# Patient Record
Sex: Female | Born: 1980 | Hispanic: Yes | Marital: Single | Smoking: Never smoker
Health system: Southern US, Community
[De-identification: ages and names within clinical notes are randomized; demographics above are authoritative.]

## PROBLEM LIST (undated history)

## (undated) ENCOUNTER — Inpatient Hospital Stay: Payer: Self-pay

## (undated) DIAGNOSIS — Z789 Other specified health status: Secondary | ICD-10-CM

## (undated) HISTORY — PX: NO PAST SURGERIES: SHX2092

---

## 2005-02-08 ENCOUNTER — Ambulatory Visit: Payer: Self-pay | Admitting: Family Medicine

## 2005-06-20 ENCOUNTER — Inpatient Hospital Stay (HOSPITAL_COMMUNITY): Admission: AD | Admit: 2005-06-20 | Discharge: 2005-06-22 | Payer: Self-pay | Admitting: Obstetrics and Gynecology

## 2005-06-20 ENCOUNTER — Ambulatory Visit: Payer: Self-pay | Admitting: Obstetrics and Gynecology

## 2005-07-26 ENCOUNTER — Ambulatory Visit: Payer: Self-pay | Admitting: Obstetrics and Gynecology

## 2013-11-19 LAB — HM PAP SMEAR: HM Pap smear: NEGATIVE

## 2014-06-30 DIAGNOSIS — A630 Anogenital (venereal) warts: Secondary | ICD-10-CM | POA: Insufficient documentation

## 2014-10-17 NOTE — L&D Delivery Note (Signed)
VAGINAL DELIVERY NOTE:  Date of Delivery: 05/20/2015 Primary OB: [facility]  Gestational Age/EDD: [redacted]w[redacted]d 05/23/2015, Alternate EDD Entry Antepartum complications: gestational diabetes Attending Physician: Annamarie Major, MD, FACOG Delivery Type: spontaneous vaginal delivery  Anesthesia: none Laceration: none Episiotomy: none Placenta: spontaneous Intrapartum complications: None Estimated Blood Loss: 100 ml GBS: pos Procedure Details: CTSP as Dr Dalbert Garnet enroute and I was in office and pt was 9/100/vtx0 and pt a multip. Arrived and gowned and gloved and pt pushed in 1 contraction, NSVD of viable female infant in straight OA pos, Ant and post shoulder and body all in one push, assisted to abd. CCx2 and cut per friend. No cord blood needed. SDOP intact. FF and lochia mod, VSS. Hemostasis achieved. No lacs needing repair. Needle and sponge ct correct.   Baby: Liveborn female, Apgars 8/9, weight 7 #, 1oz. Pt tol well., Baby named Anette Riedel

## 2014-11-27 LAB — OB RESULTS CONSOLE GC/CHLAMYDIA
CHLAMYDIA, DNA PROBE: POSITIVE
GC PROBE AMP, GENITAL: NEGATIVE

## 2014-11-27 LAB — OB RESULTS CONSOLE ANTIBODY SCREEN: Antibody Screen: NEGATIVE

## 2014-11-27 LAB — OB RESULTS CONSOLE ABO/RH: RH Type: POSITIVE

## 2014-11-27 LAB — OB RESULTS CONSOLE RPR: RPR: NONREACTIVE

## 2014-12-08 ENCOUNTER — Ambulatory Visit: Payer: Self-pay | Admitting: Family Medicine

## 2014-12-25 LAB — OB RESULTS CONSOLE GC/CHLAMYDIA
CHLAMYDIA, DNA PROBE: NEGATIVE
Gonorrhea: NEGATIVE

## 2015-04-06 ENCOUNTER — Encounter: Payer: Self-pay | Admitting: *Deleted

## 2015-04-06 ENCOUNTER — Encounter: Payer: Medicaid Other | Attending: Family Medicine | Admitting: *Deleted

## 2015-04-06 VITALS — BP 92/60 | Ht 60.75 in | Wt 152.7 lb

## 2015-04-06 DIAGNOSIS — O24419 Gestational diabetes mellitus in pregnancy, unspecified control: Secondary | ICD-10-CM | POA: Insufficient documentation

## 2015-04-06 DIAGNOSIS — O24410 Gestational diabetes mellitus in pregnancy, diet controlled: Secondary | ICD-10-CM

## 2015-04-06 NOTE — Progress Notes (Signed)
Diabetes Self-Management Education  Visit Type: First/Initial  Appt. Start Time: 1345 Appt. End Time: 1530  04/06/2015  [name], identified by name and date of birth, is a 34 y.o. female with a diagnosis of Diabetes: Gestational Diabetes.  Other people present during visit:  Family Member (son)   ASSESSMENT Blood pressure 92/60, height 5' 0.75" (1.543 m), weight 152 lb 11.2 oz (69.264 kg), last menstrual period 08/16/2014. Body mass index is 29.09 kg/(m^2).  Initial Visit Information: Are you currently following a meal plan?: No Are you taking your medications as prescribed?: Yes Are you checking your feet?: No What is the last grade level you completed in school?: 6 grade  Psychosocial: Patient Belief/Attitude about Diabetes: Motivated to manage diabetes Self-care barriers: English as a second language, Low literacy Self-management support: Doctor's office, Family Other persons present: Family Member (son) Patient Concerns: Nutrition/Meal planning, Monitoring, Glycemic Control, Other (Become more fit) Special Needs: Other (Materials in Spanish) Preferred Learning Style: Auditory, Hands on Learning Readiness: Ready  Complications:  How often do you check your blood sugar?: 0 times/day (not testing) (Instructed pt on use of One Touch Nano meter. BG upon return demonstration was 105 mg/dL at 8:78 pm - 4 1/2 hrs pp) Have you had a dilated eye exam in the past 12 months?: No Have you had a dental exam in the past 12 months?: No  Diet Intake: Breakfast: cereal, bread, cookies Lunch: rice, beans, meat that is fried or grilled Dinner: cereal, sandwich fruit Beverage(s): coffee with sugar, water, juice  Exercise: Exercise: ADL's  Individualized Plan for Diabetes Self-Management Training:  Learning Objective:  Patient will have a greater understanding of diabetes self-management.  Education Topics Reviewed with Patient Today: Factors that contribute to the  development of diabetes Food label reading, portion sizes and measuring food. Role of exercise on diabetes management, blood pressure control and cardiac health. Taught/evaluated SMBG meter., Purpose and frequency of SMBG., Identified appropriate SMBG and/or A1C goals. Relationship between chronic complications and blood glucose control Pregnancy and GDM  Role of pre-pregnancy blood glucose control on the development of the fetus, Reviewed with patient blood glucose goals with pregnancy  PATIENTS GOALS/Plan (Developed by the patient): Read booklet on Gestational Diabetes Follow Gestational Meal Planning Guidelines Complete a 3 Day Food Record and bring to next appointment Limit fried foods Avoid fruit juice and sugar in coffee Check blood sugars 4 x day - before breakfast and 2 hrs after every meal and record  Call MD for prescription for meter strips and lancets Strips   Accu-Chek SmartView Lancets   Accu-Chek FastClix Bring blood sugar log to next appointment Walk 20-30 minutes at least 5 x week if permitted by MD  Expected Outcomes:  Demonstrated interest in learning. Expect positive outcomes  Education material provided:  Gestational Meal Planning Guidelines (Spanish) Viewed Gestational Diabetes Video (Spanish) Accu-Chek Nano meter 3 Day Food Record (Spanish) Goals for a Healthy Pregnancy (Spanish)   If problems or questions, patient to contact team via:   Sharion Settler, RN, CCM, CDE [phone]  Future DSME appointment:  April 16, 2015 at 3:00 pm with dietitian

## 2015-04-16 ENCOUNTER — Ambulatory Visit: Payer: Medicaid Other | Admitting: Dietician

## 2015-04-23 ENCOUNTER — Encounter: Payer: Medicaid Other | Attending: Family Medicine | Admitting: Dietician

## 2015-04-23 ENCOUNTER — Ambulatory Visit: Payer: Medicaid Other | Admitting: Dietician

## 2015-04-23 VITALS — BP 100/64 | Ht 60.75 in | Wt 156.1 lb

## 2015-04-23 DIAGNOSIS — O24419 Gestational diabetes mellitus in pregnancy, unspecified control: Secondary | ICD-10-CM | POA: Insufficient documentation

## 2015-04-23 DIAGNOSIS — O24410 Gestational diabetes mellitus in pregnancy, diet controlled: Secondary | ICD-10-CM

## 2015-04-23 NOTE — Progress Notes (Signed)
Patient's BG record indicates FBGs within goal range; 2hr after breakfast 5 of 20 readings >120; 2hr after lunch 4 readings >120; 2hr after supper 2 readings >120.   Patient did not have completed food diary; verbal report indicates generally balanced meals.  Provided 1700kcal meal plan, and wrote individualized menus based on patient's food preferences. Instructed patient on food safety, including avoidance of Listeriosis, and limiting mercury from fish. Discussed importance of maintaining healthy lifestyle habits to reduce risk of Type 2 DM as well as Gestational DM with any future pregnancies. Advised patient to use any remaining testing supplies to test some BGs after delivery, and to have BG tested ideally annually, as well as prior to attempting future pregnancies.

## 2015-04-23 NOTE — Patient Instructions (Addendum)
   Advised pt verballly to analyze BGs, when having a high BG >120, to see if she overate carbohydrate, or other possible cause.  Advised 1 burger/ chicken sandwich (if craving) and fruit, to avoid fries.

## 2015-05-19 ENCOUNTER — Observation Stay
Admission: EM | Admit: 2015-05-19 | Discharge: 2015-05-19 | Disposition: A | Discharge status: Home / Self Care | Payer: Medicaid Other | Attending: Obstetrics and Gynecology | Admitting: Obstetrics and Gynecology

## 2015-05-19 DIAGNOSIS — O26899 Other specified pregnancy related conditions, unspecified trimester: Secondary | ICD-10-CM

## 2015-05-19 DIAGNOSIS — R109 Unspecified abdominal pain: Secondary | ICD-10-CM

## 2015-05-19 DIAGNOSIS — Z3A39 39 weeks gestation of pregnancy: Secondary | ICD-10-CM | POA: Insufficient documentation

## 2015-05-19 HISTORY — DX: Other specified health status: Z78.9

## 2015-05-19 LAB — OB RESULTS CONSOLE GBS: GBS: POSITIVE

## 2015-05-19 NOTE — Discharge Instructions (Signed)
Contracciones de Braxton Hicks °(Braxton Hicks Contractions) °Durante el embarazo, pueden presentarse contracciones uterinas que no siempre indican que está en trabajo de parto.  °¿QUÉ SON LAS CONTRACCIONES DE BRAXTON HICKS?  °Las contracciones que se presentan antes del trabajo de parto se conocen como contracciones de Braxton Hicks o falso trabajo de parto. Hacia el final del embarazo (32 a 34 semanas), estas contracciones pueden aparecen con más frecuencia y volverse más intensas. No corresponden al trabajo de parto verdadero porque estas contracciones no producen el agrandamiento (la dilatación) y el afinamiento del cuello del útero. Algunas veces, es difícil distinguirlas del trabajo de parto verdadero porque en algunos casos pueden ser muy intensas, y las personas tienen diferentes niveles de tolerancia al dolor. No debe sentirse avergonzada si concurre al hospital con falso trabajo de parto. En ocasiones, la única forma de saber si el trabajo de parto es verdadero es que el médico determine si hay cambios en el cuello del útero. °Si no hay problemas prenatales u otras complicaciones de salud asociadas con el embarazo, no habrá inconvenientes si la envían a su casa con falso trabajo de parto y espera que comience el verdadero. °CÓMO DIFERENCIAR EL TRABAJO DE PARTO FALSO DEL VERDADERO °Falso trabajo de parto °· Las contracciones del falso trabajo de parto duran menos y no son tan intensas como las verdaderas. °· Generalmente son irregulares. °· A menudo, se sienten en la parte delantera de la parte baja del abdomen y en la ingle, °· y pueden desaparecer cuando camina o cambia de posición mientras está acostada. °· Las contracciones se vuelven más débiles y su duración es menor a medida que el tiempo transcurre. °· Por lo general, no se hacen progresivamente más intensas, regulares y cercanas entre sí como en el caso del trabajo de parto verdadero. °Verdadero trabajo de parto °· Las contracciones del verdadero  trabajo de parto duran de 30 a 70 segundos, son muy regulares y suelen volverse más intensas, y aumenta su frecuencia. °· No desaparecen cuando camina. °· La molestia generalmente se siente en la parte superior del útero y se extiende hacia la zona inferior del abdomen y hacia la cintura. °· El médico podrá examinarla para determinar si el trabajo de parto es verdadero. El examen mostrará si el cuello del útero se está dilatando y afinando. °LO QUE DEBE RECORDAR °· Continúe haciendo los ejercicios habituales y siga otras indicaciones que el médico le dé. °· Tome todos los medicamentos como le indicó el médico. °· Concurra a las visitas prenatales regulares. °· Coma y beba con moderación si cree que está en trabajo de parto. °· Si las contracciones de Braxton Hicks le provocan incomodidad: °¨ Cambie de posición: si está acostada o descansando, camine; si está caminando, descanse. °¨ Siéntese y descanse en una bañera con agua tibia. °¨ Beba 2 o 3 vasos de agua. La deshidratación puede provocar contracciones. °¨ Respire lenta y profundamente varias veces por hora. °¿CUÁNDO DEBO BUSCAR ASISTENCIA MÉDICA INMEDIATA? °Solicite atención médica de inmediato si: °· Las contracciones se intensifican, se hacen más regulares y cercanas entre sí. °· Tiene una pérdida de líquido por la vagina. °· Tiene fiebre. °· Elimina mucosidad manchada con sangre. °· Tiene una hemorragia vaginal abundante. °· Tiene dolor abdominal permanente. °· Tiene un dolor en la zona lumbar que nunca tuvo antes. °· Siente que la cabeza del bebé empuja hacia abajo y ejerce presión en la zona pélvica. °· El bebé no se mueve tanto como solía. °Document Released: 07/13/2005 Document Revised: 10/08/2013 °[employer]® Patient   Information ©2015 [employer]. This information is not intended to replace advice given to you by your health care provider. Make sure you discuss any questions you have with your health care provider. ° °

## 2015-05-19 NOTE — Progress Notes (Signed)
Will continue EOL r/t cervical change from last exam per MD.

## 2015-05-19 NOTE — OB Triage Note (Signed)
Patient c/o contractions for past three hours

## 2015-05-20 ENCOUNTER — Encounter: Payer: Self-pay | Admitting: *Deleted

## 2015-05-20 ENCOUNTER — Inpatient Hospital Stay
Admission: EM | Admit: 2015-05-20 | Discharge: 2015-05-22 | DRG: 774 | Disposition: A | Discharge status: Home / Self Care | Payer: Medicaid Other | Attending: Obstetrics and Gynecology | Admitting: Obstetrics and Gynecology

## 2015-05-20 DIAGNOSIS — O9832 Other infections with a predominantly sexual mode of transmission complicating childbirth: Secondary | ICD-10-CM | POA: Diagnosis present

## 2015-05-20 DIAGNOSIS — O9902 Anemia complicating childbirth: Secondary | ICD-10-CM | POA: Diagnosis present

## 2015-05-20 DIAGNOSIS — O24420 Gestational diabetes mellitus in childbirth, diet controlled: Secondary | ICD-10-CM | POA: Diagnosis present

## 2015-05-20 DIAGNOSIS — Z3A39 39 weeks gestation of pregnancy: Secondary | ICD-10-CM | POA: Diagnosis present

## 2015-05-20 DIAGNOSIS — Z87891 Personal history of nicotine dependence: Secondary | ICD-10-CM

## 2015-05-20 DIAGNOSIS — A749 Chlamydial infection, unspecified: Secondary | ICD-10-CM | POA: Diagnosis present

## 2015-05-20 DIAGNOSIS — O9852 Other viral diseases complicating childbirth: Secondary | ICD-10-CM | POA: Diagnosis present

## 2015-05-20 DIAGNOSIS — O99824 Streptococcus B carrier state complicating childbirth: Secondary | ICD-10-CM | POA: Diagnosis present

## 2015-05-20 LAB — CHLAMYDIA/NGC RT PCR (ARMC ONLY)
CHLAMYDIA TR: NOT DETECTED
N GONORRHOEAE: NOT DETECTED

## 2015-05-20 LAB — TYPE AND SCREEN
ABO/RH(D): A POS
ANTIBODY SCREEN: NEGATIVE

## 2015-05-20 LAB — CBC
HCT: 35.4 % (ref 35.0–47.0)
Hemoglobin: 12 g/dL (ref 12.0–16.0)
MCH: 29.3 pg (ref 26.0–34.0)
MCHC: 33.8 g/dL (ref 32.0–36.0)
MCV: 86.8 fL (ref 80.0–100.0)
Platelets: 138 10*3/uL — ABNORMAL LOW (ref 150–440)
RBC: 4.08 MIL/uL (ref 3.80–5.20)
RDW: 13.2 % (ref 11.5–14.5)
WBC: 8 10*3/uL (ref 3.6–11.0)

## 2015-05-20 LAB — ABO/RH: ABO/RH(D): A POS

## 2015-05-20 MED ORDER — PRENATAL MULTIVITAMIN CH
1.0000 | ORAL_TABLET | Freq: Every day | ORAL | Status: DC
  Administered 2015-05-20 – 2015-05-22 (×3): 1 via ORAL
  Filled 2015-05-20 (×3): qty 1

## 2015-05-20 MED ORDER — DIPHENHYDRAMINE HCL 25 MG PO CAPS: 25.0000 mg | ORAL_CAPSULE | Freq: Four times a day (QID) | ORAL | Status: DC | PRN

## 2015-05-20 MED ORDER — ONDANSETRON HCL 4 MG/2ML IJ SOLN: 4.0000 mg | Freq: Four times a day (QID) | INTRAMUSCULAR | Status: DC | PRN

## 2015-05-20 MED ORDER — OXYCODONE-ACETAMINOPHEN 5-325 MG PO TABS: 2.0000 | ORAL_TABLET | ORAL | Status: DC | PRN

## 2015-05-20 MED ORDER — OXYCODONE-ACETAMINOPHEN 5-325 MG PO TABS: 1.0000 | ORAL_TABLET | ORAL | Status: DC | PRN

## 2015-05-20 MED ORDER — CITRIC ACID-SODIUM CITRATE 334-500 MG/5ML PO SOLN: 30.0000 mL | ORAL | Status: DC | PRN

## 2015-05-20 MED ORDER — SODIUM CHLORIDE 0.9 % IJ SOLN: 3.0000 mL | INTRAMUSCULAR | Status: DC | PRN

## 2015-05-20 MED ORDER — LACTATED RINGERS IV SOLN: 500.0000 mL | INTRAVENOUS | Status: DC | PRN

## 2015-05-20 MED ORDER — LACTATED RINGERS IV SOLN
INTRAVENOUS | Status: DC
  Administered 2015-05-20: 09:00:00 via INTRAVENOUS

## 2015-05-20 MED ORDER — SODIUM CHLORIDE 0.9 % IV SOLN
2.0000 g | Freq: Once | INTRAVENOUS | Status: AC
  Administered 2015-05-20: 2 g via INTRAVENOUS
  Filled 2015-05-20: qty 2000

## 2015-05-20 MED ORDER — IBUPROFEN 600 MG PO TABS
ORAL_TABLET | ORAL | Status: AC
  Administered 2015-05-20: 600 mg via ORAL
  Filled 2015-05-20: qty 1

## 2015-05-20 MED ORDER — OXYCODONE-ACETAMINOPHEN 5-325 MG PO TABS
1.0000 | ORAL_TABLET | ORAL | Status: DC | PRN
Start: 2015-05-20 — End: 2015-05-22
  Administered 2015-05-20 (×2): 1 via ORAL
  Filled 2015-05-20: qty 1

## 2015-05-20 MED ORDER — OXYTOCIN BOLUS FROM INFUSION: 500.0000 mL | INTRAVENOUS | Status: DC

## 2015-05-20 MED ORDER — VARICELLA VIRUS VACCINE LIVE 1350 PFU/0.5ML IJ SUSR
0.5000 mL | INTRAMUSCULAR | Status: AC | PRN
  Administered 2015-05-22: 0.5 mL via SUBCUTANEOUS
  Filled 2015-05-20 (×2): qty 0.5

## 2015-05-20 MED ORDER — OXYCODONE-ACETAMINOPHEN 5-325 MG PO TABS
1.0000 | ORAL_TABLET | ORAL | Status: DC | PRN
  Filled 2015-05-20: qty 1

## 2015-05-20 MED ORDER — ONDANSETRON HCL 4 MG/2ML IJ SOLN: 4.0000 mg | INTRAMUSCULAR | Status: DC | PRN

## 2015-05-20 MED ORDER — ONDANSETRON HCL 4 MG PO TABS: 4.0000 mg | ORAL_TABLET | ORAL | Status: DC | PRN

## 2015-05-20 MED ORDER — OXYTOCIN 40 UNITS IN LACTATED RINGERS INFUSION - SIMPLE MED
62.5000 mL/h | INTRAVENOUS | Status: DC
  Administered 2015-05-20: 62.5 mL/h via INTRAVENOUS
  Filled 2015-05-20 (×2): qty 1000

## 2015-05-20 MED ORDER — DIBUCAINE 1 % RE OINT: 1.0000 "application " | TOPICAL_OINTMENT | RECTAL | Status: DC | PRN

## 2015-05-20 MED ORDER — PENICILLIN G POTASSIUM 5000000 UNITS IJ SOLR
5.0000 10*6.[IU] | Freq: Once | INTRAMUSCULAR | Status: DC
  Filled 2015-05-20: qty 5

## 2015-05-20 MED ORDER — SODIUM CHLORIDE 0.9 % IV SOLN
1.0000 g | INTRAVENOUS | Status: DC
  Filled 2015-05-20 (×6): qty 1000

## 2015-05-20 MED ORDER — WITCH HAZEL-GLYCERIN EX PADS: 1.0000 "application " | MEDICATED_PAD | CUTANEOUS | Status: DC | PRN

## 2015-05-20 MED ORDER — PENICILLIN G POTASSIUM 5000000 UNITS IJ SOLR
2.5000 10*6.[IU] | INTRAMUSCULAR | Status: DC
  Filled 2015-05-20 (×6): qty 2.5

## 2015-05-20 MED ORDER — LACTATED RINGERS IV SOLN: INTRAVENOUS | Status: DC

## 2015-05-20 MED ORDER — SODIUM CHLORIDE 0.9 % IJ SOLN
3.0000 mL | Freq: Two times a day (BID) | INTRAMUSCULAR | Status: DC
  Administered 2015-05-21: 3 mL via INTRAVENOUS

## 2015-05-20 MED ORDER — BENZOCAINE-MENTHOL 20-0.5 % EX AERO: 1.0000 "application " | INHALATION_SPRAY | CUTANEOUS | Status: DC | PRN

## 2015-05-20 MED ORDER — BISACODYL 10 MG RE SUPP: 10.0000 mg | Freq: Every day | RECTAL | Status: DC | PRN

## 2015-05-20 MED ORDER — ACETAMINOPHEN 325 MG PO TABS: 650.0000 mg | ORAL_TABLET | ORAL | Status: DC | PRN

## 2015-05-20 MED ORDER — IBUPROFEN 600 MG PO TABS
600.0000 mg | ORAL_TABLET | Freq: Four times a day (QID) | ORAL | Status: DC
  Administered 2015-05-20 – 2015-05-22 (×9): 600 mg via ORAL
  Filled 2015-05-20 (×8): qty 1

## 2015-05-20 MED ORDER — OXYTOCIN 40 UNITS IN LACTATED RINGERS INFUSION - SIMPLE MED
62.5000 mL/h | INTRAVENOUS | Status: DC | PRN
  Administered 2015-05-20: 62.5 mL/h via INTRAVENOUS

## 2015-05-20 MED ORDER — SIMETHICONE 80 MG PO CHEW
80.0000 mg | CHEWABLE_TABLET | ORAL | Status: DC | PRN
Start: 2015-05-20 — End: 2015-05-22

## 2015-05-20 MED ORDER — LANOLIN HYDROUS EX OINT: TOPICAL_OINTMENT | CUTANEOUS | Status: DC | PRN

## 2015-05-20 MED ORDER — LIDOCAINE HCL (PF) 1 % IJ SOLN
30.0000 mL | INTRAMUSCULAR | Status: DC | PRN
  Filled 2015-05-20: qty 30

## 2015-05-20 MED ORDER — SENNOSIDES-DOCUSATE SODIUM 8.6-50 MG PO TABS
2.0000 | ORAL_TABLET | ORAL | Status: DC
  Administered 2015-05-20 – 2015-05-22 (×2): 2 via ORAL
  Filled 2015-05-20 (×2): qty 2

## 2015-05-20 MED ORDER — ZOLPIDEM TARTRATE 5 MG PO TABS: 5.0000 mg | ORAL_TABLET | Freq: Every evening | ORAL | Status: DC | PRN

## 2015-05-20 MED ORDER — FLEET ENEMA 7-19 GM/118ML RE ENEM: 1.0000 | ENEMA | Freq: Every day | RECTAL | Status: DC | PRN

## 2015-05-20 MED ORDER — BUTORPHANOL TARTRATE 1 MG/ML IJ SOLN: 1.0000 mg | INTRAMUSCULAR | Status: DC | PRN

## 2015-05-20 MED ORDER — SODIUM CHLORIDE 0.9 % IV SOLN: 250.0000 mL | INTRAVENOUS | Status: DC | PRN

## 2015-05-20 NOTE — H&P (Signed)
Robin Barr is a 34 y.o. female [phone] at 39+4wks presenting for *active labor. Painful contractions, leaking clear fluid. GBS postitive.  Maternal Medical History:  Reason for admission: Rupture of membranes and contractions.   Contractions: Onset was yesterday.   Frequency: irregular.   Perceived severity is moderate.    Fetal activity: Perceived fetal activity is normal.      OB History    Gravida Para Term Preterm AB TAB SAB Ectopic Multiple Living       Past Medical History  Diagnosis Date  . Medical history non-contributory    Past Surgical History  Procedure Laterality Date  . No past surgeries     Family History: family history is not on file. Social History:  reports that she has quit smoking. She has never used smokeless tobacco. She reports that she does not drink alcohol or use illicit drugs.   Prenatal Transfer Tool   ROS  Dilation: 6 Effacement (%): 100 Station: -2 Exam by:: LSE Blood pressure 130/88, pulse 97, temperature 98.2 F (36.8 C), temperature source Oral, resp. rate 18, last menstrual period 08/16/2014. Exam Physical Exam  Prenatal labs: ABO, Rh: A/Positive/-- (02/11 0000) Antibody: Negative (02/11 0000) Rubella:   RPR: Nonreactive (02/11 0000)  HBsAg:    HIV:    GBS: Positive (08/02 0000)   Assessment/Plan: 1. Admit for active labor 2. Start ampicillin for GBS ppx 3. Pain meds PRN 4. May have epidural when desired.    Christeen Douglas 05/20/2015, 8:38 AM    ADDENDUM: Pt delivered with our Midwife Milon Score prior to my arrival on the floor. Please see her completed H&P for exam.

## 2015-05-20 NOTE — Plan of Care (Signed)
Problem: Phase II Progression Outcomes Goal: Initiate breastfeeding within 1hr delivery Outcome: Not Met (add Reason) Mom breast and bottle feeding - requesting to bottle feed first

## 2015-05-20 NOTE — H&P (Signed)
Robin Barr is a 34 y.o. female w/ LMP of 08/24/14 & EDD of 05/23/15 with matching [place] dating at 52 2/7 weeks  presenting for active labor and cx progress. PNC at [facility] significant for Gest DM, diet controlled, anemia, +chlamydia, neg TOC, UTI, Varicella non-immune, passive smoke exsposure, , Breast Mass, Rt, GBS pos,  Maternal Medical History:  Reason for admission: Contractions.   Contractions: Onset was 3-5 hours ago.    Fetal activity: Perceived fetal activity is normal.    Prenatal complications: No bleeding, cholelithiasis, HIV, PIH, infection, IUGR, nephrolithiasis, oligohydramnios, placental abnormality, polyhydramnios, pre-eclampsia, preterm labor, substance abuse, thrombocytopenia or thrombophilia.     OB History    Gravida Para Term Preterm AB TAB SAB Ectopic Multiple Living   0 0 0 0 0 0 4    PMH: GI disorders, UTI, ETOH use, condyloma, breast mass  Past Surgical History  Procedure Laterality Date  . No past surgeries     Family History: family history is not on file. Social History:  reports that she has quit smoking. She has never used smokeless tobacco. She reports that she does not drink alcohol or use illicit drugs.   Prenatal Transfer Tool  Maternal Diabetes: Yes:  Diabetes Type:  Diet controlled Genetic Screening: Normal Maternal Ultrasounds/Referrals: Normal Fetal Ultrasounds or other Referrals:  None Maternal Substance Abuse:  No Significant Maternal Medications:  None Significant Maternal Lab Results:  Lab values include: Group B Strep positive Other Comments:  None  ROS Benign x 9 Dilation: 6 Effacement (%): 100 Station: -2 Exam by:: LSE Blood pressure 130/88, pulse 97, temperature 98.2 F (36.8 C), temperature source Oral, resp. rate 18, last menstrual period 08/16/2014, unknown if currently breastfeeding. Exam Physical Exam  Gen: 34 yo Hispanic female in NAD, A,A,Ox3 HEENT: normocephalic, PERL.Eyes: non-icteric Heart: S1S2, RRR, No  M/R/G. Lungs: CTA Bilat, No W/R/R. Abd: Gravid, EFW: 7#8oz Extrems: no edema, DTR's not checked as pt deliiverd with arrival Prenatal labs: ABO, Rh: A/Positive/-- (02/11 0000) Antibody: Negative (02/11 0000) Rubella:  Immune RPR: Nonreactive (02/11 0000)  HBsAg:   Neg HIV:   Neg GBS: Positive (08/02 0000)  Varicella non-immune  Assessment/Plan: A: IUP at term in active labor 2. Gest DM 3. Varicella non-immune P: ADmit for delivery 2. ABX for GBS +.    Milon Score W 05/20/2015, 9:20 AM

## 2015-05-21 LAB — CBC
HEMATOCRIT: 29.6 % — AB (ref 35.0–47.0)
HEMOGLOBIN: 10 g/dL — AB (ref 12.0–16.0)
MCH: 29.3 pg (ref 26.0–34.0)
MCHC: 33.6 g/dL (ref 32.0–36.0)
MCV: 87.2 fL (ref 80.0–100.0)
Platelets: 113 10*3/uL — ABNORMAL LOW (ref 150–440)
RBC: 3.4 MIL/uL — ABNORMAL LOW (ref 3.80–5.20)
RDW: 13.5 % (ref 11.5–14.5)
WBC: 7.5 10*3/uL (ref 3.6–11.0)

## 2015-05-21 LAB — RPR: RPR Ser Ql: NONREACTIVE

## 2015-05-21 NOTE — Progress Notes (Addendum)
Post Partum Day  1 Subjective: no complaints  Objective: Blood pressure 111/71, pulse 72, temperature 97.9 F (36.6 C), temperature source Oral, resp. rate 18, height 5' (1.524 m), weight 71.215 kg (157 lb), last menstrual period 08/16/2014, SpO2 100 %, unknown if currently breastfeeding.  Physical Exam:  General: alert  Heart: S1S2, RRR, no M/R/G Lungs: CTA bilat, no W/R/R Lochia: appropriate Uterine Fundus: firm Voiding well, taking po well. Incision: none DVT Evaluation: No evidence of DVT seen on physical exam.   Recent Labs  05/20/15 0905 05/21/15 0617  HGB 12.0 10.0*  HCT 35.4 29.6*    Assessment/Plan: Plan for discharge tomorrow   LOS: 1 day   Sharee Pimple 05/21/2015, 9:36 AM

## 2015-05-22 MED ORDER — IBUPROFEN 600 MG PO TABS: 600.0000 mg | ORAL_TABLET | Freq: Four times a day (QID) | ORAL | Status: DC

## 2015-05-22 NOTE — Discharge Summary (Signed)
   Physician Obstetric Discharge Summary  Patient ID: Makaiya Geerdes MRN: [id number] DOB/AGE: November 28, 1980 34 y.o.   Date of Admission: 05/20/2015  Date of Discharge: 05/22/15  Admitting Diagnosis: Onset of Labor at [redacted]w[redacted]d  Secondary Diagnosis: Anemia in pregnancy  Mode of Delivery: normal spontaneous vaginal delivery     Discharge Diagnosis: None   Intrapartum Procedures: none   Post partum procedures: none  Complications: none   Brief Hospital Course /// Anshi Jalloh is a [id number] who had a SVD on 05/20/15;  for further details of this delivery, please refer to the delivery note.  Patient had an uncomplicated postpartum course.  By time of discharge on PPD#2, her pain was controlled on oral pain medications; she had appropriate lochia and was ambulating, voiding without difficulty and tolerating regular diet.  She was deemed stable for discharge to home.    Labs: CBC Latest Ref Rng 05/21/2015 05/20/2015  WBC 3.6 - 11.0 K/uL 7.5 8.0  Hemoglobin 12.0 - 16.0 g/dL 10.0(L) 12.0  Hematocrit 35.0 - 47.0 % 29.6(L) 35.4  Platelets 150 - 440 K/uL 113(L) 138(L)   A POS  Physical exam:  Blood pressure 99/58, pulse 66, temperature 97.8 F (36.6 C), temperature source Oral, resp. rate 16, height 5' (1.524 m), weight 71.215 kg (157 lb), last menstrual period 08/16/2014, SpO2 100 %, unknown if currently breastfeeding. General: alert and no distress Lochia: appropriate Abdomen: soft, NT Uterine Fundus: firm Extremities: No evidence of DVT seen on physical exam. No lower extremity edema.  Discharge Instructions: Per After Visit Summary. Activity: Advance as tolerated. Pelvic rest for 6 weeks.  Also refer to Discharge Instructions Diet: Regular Medications:   Medication List    TAKE these medications        ibuprofen 600 MG tablet  Commonly known as:  ADVIL,MOTRIN  Take 1 tablet (600 mg total) by mouth every 6 (six) hours.     PRENATAL VITAMINS PO  Take 1 tablet by  mouth daily.       Outpatient follow up:  Postpartum contraception: no method  Discharged Condition: good  Discharged to: home   Newborn Data: Disposition:home with mother  Apgars: APGAR (1 MIN): 8   APGAR (5 MINS): 9   APGAR (10 MINS):    Baby Feeding: Maudry Mayhew, MD 05/22/2015 1:15 PM

## 2015-05-22 NOTE — Progress Notes (Signed)
Interpreter present for discharge teaching and instruction. All questions answered.  Follow up appointment information given for both pt and newborn.

## 2016-10-05 ENCOUNTER — Other Ambulatory Visit: Payer: Self-pay | Admitting: Nurse Practitioner

## 2016-10-05 DIAGNOSIS — Z3482 Encounter for supervision of other normal pregnancy, second trimester: Secondary | ICD-10-CM

## 2016-10-05 LAB — OB RESULTS CONSOLE HIV ANTIBODY (ROUTINE TESTING): HIV: NONREACTIVE

## 2016-10-06 LAB — OB RESULTS CONSOLE HGB/HCT, BLOOD
HEMATOCRIT: 35
HEMOGLOBIN: 11.9

## 2016-10-06 LAB — OB RESULTS CONSOLE PLATELET COUNT: Platelets: 205

## 2016-10-07 ENCOUNTER — Other Ambulatory Visit: Payer: Self-pay | Admitting: Nurse Practitioner

## 2016-10-07 ENCOUNTER — Ambulatory Visit
Admission: RE | Admit: 2016-10-07 | Discharge: 2016-10-07 | Disposition: A | Discharge status: Home / Self Care | Payer: Self-pay | Source: Ambulatory Visit | Attending: Nurse Practitioner | Admitting: Nurse Practitioner

## 2016-10-07 DIAGNOSIS — Z3A11 11 weeks gestation of pregnancy: Secondary | ICD-10-CM | POA: Insufficient documentation

## 2016-10-07 DIAGNOSIS — Z3687 Encounter for antenatal screening for uncertain dates: Secondary | ICD-10-CM | POA: Insufficient documentation

## 2016-10-07 DIAGNOSIS — Z3482 Encounter for supervision of other normal pregnancy, second trimester: Secondary | ICD-10-CM

## 2016-10-07 LAB — OB RESULTS CONSOLE GC/CHLAMYDIA: GC PROBE AMP, GENITAL: NEGATIVE

## 2016-10-17 NOTE — L&D Delivery Note (Signed)
Delivery Note At 6:31 AM a viable female was delivered via Vaginal, Spontaneous Delivery (Presentation: ;vtx/ roa  ).  APGAR: ,8/9 ; weight  .   Placenta status:intact  , .  Cord: delayed cord clamping   with the following complications: precipitous labor: Pt progressed from 3 cm to complete within 15 minutes . SVD female with nuchal cord reduced . Delayed cord clamping . Vigorous female . Inadequate GBS prophylaxis , peds aware  0.2 mg IM  Methergine  Anesthesia:   Episiotomy: None Lacerations:  none Suture Repair: n/a Est. Blood Loss (mL): 100 cc    Mom to postpartum.  Baby to Couplet care / Skin to Skin.  Ihor Austinhomas J Phung Kotas 05/01/2017, 6:53 AM

## 2016-11-08 ENCOUNTER — Other Ambulatory Visit: Payer: Self-pay | Admitting: Advanced Practice Midwife

## 2016-11-08 DIAGNOSIS — Z3402 Encounter for supervision of normal first pregnancy, second trimester: Secondary | ICD-10-CM

## 2016-11-14 ENCOUNTER — Ambulatory Visit
Admission: RE | Admit: 2016-11-14 | Discharge: 2016-11-14 | Disposition: A | Discharge status: Home / Self Care | Payer: Self-pay | Source: Ambulatory Visit | Attending: Advanced Practice Midwife | Admitting: Advanced Practice Midwife

## 2016-11-14 DIAGNOSIS — Z3402 Encounter for supervision of normal first pregnancy, second trimester: Secondary | ICD-10-CM

## 2016-11-17 ENCOUNTER — Other Ambulatory Visit: Payer: Self-pay | Admitting: Advanced Practice Midwife

## 2016-11-24 DIAGNOSIS — N6019 Diffuse cystic mastopathy of unspecified breast: Secondary | ICD-10-CM | POA: Insufficient documentation

## 2016-11-28 ENCOUNTER — Ambulatory Visit: Payer: Self-pay

## 2017-02-09 LAB — OB RESULTS CONSOLE HIV ANTIBODY (ROUTINE TESTING): HIV: NONREACTIVE

## 2017-02-10 LAB — OB RESULTS CONSOLE RPR
RPR: NONREACTIVE
RPR: NONREACTIVE
RPR: NONREACTIVE

## 2017-03-31 LAB — OB RESULTS CONSOLE GBS
GBS: POSITIVE
GBS: POSITIVE
STREP GROUP B AG: POSITIVE

## 2017-04-01 LAB — OB RESULTS CONSOLE GC/CHLAMYDIA
Chlamydia: NEGATIVE
Chlamydia: NEGATIVE
GC PROBE AMP, GENITAL: NEGATIVE
GC PROBE AMP, GENITAL: NEGATIVE
GC PROBE AMP, GENITAL: NEGATIVE

## 2017-04-13 LAB — OB RESULTS CONSOLE HIV ANTIBODY (ROUTINE TESTING)
HIV: NONREACTIVE
HIV: NONREACTIVE

## 2017-04-14 LAB — OB RESULTS CONSOLE HEPATITIS B SURFACE ANTIGEN
HEP B S AG: NEGATIVE
Hepatitis B Surface Ag: NEGATIVE
Hepatitis B Surface Ag: NEGATIVE

## 2017-04-28 LAB — OB RESULTS CONSOLE VARICELLA ZOSTER ANTIBODY, IGG
VARICELLA IGG: NON-IMMUNE/NOT IMMUNE
VARICELLA IGG: NON-IMMUNE/NOT IMMUNE

## 2017-04-28 LAB — OB RESULTS CONSOLE RUBELLA ANTIBODY, IGM
RUBELLA: IMMUNE
RUBELLA: IMMUNE

## 2017-05-01 ENCOUNTER — Inpatient Hospital Stay
Admission: EM | Admit: 2017-05-01 | Discharge: 2017-05-03 | DRG: 775 | Disposition: A | Discharge status: Home / Self Care | Payer: Medicaid Other | Attending: Obstetrics and Gynecology | Admitting: Obstetrics and Gynecology

## 2017-05-01 DIAGNOSIS — O99824 Streptococcus B carrier state complicating childbirth: Secondary | ICD-10-CM | POA: Diagnosis present

## 2017-05-01 DIAGNOSIS — Z3490 Encounter for supervision of normal pregnancy, unspecified, unspecified trimester: Secondary | ICD-10-CM

## 2017-05-01 DIAGNOSIS — Z3A40 40 weeks gestation of pregnancy: Secondary | ICD-10-CM

## 2017-05-01 DIAGNOSIS — Z87891 Personal history of nicotine dependence: Secondary | ICD-10-CM | POA: Diagnosis not present

## 2017-05-01 DIAGNOSIS — Z3493 Encounter for supervision of normal pregnancy, unspecified, third trimester: Secondary | ICD-10-CM | POA: Diagnosis present

## 2017-05-01 LAB — CBC
HEMATOCRIT: 35.2 % (ref 35.0–47.0)
HEMOGLOBIN: 12.1 g/dL (ref 12.0–16.0)
MCH: 30.2 pg (ref 26.0–34.0)
MCHC: 34.5 g/dL (ref 32.0–36.0)
MCV: 87.5 fL (ref 80.0–100.0)
Platelets: 123 10*3/uL — ABNORMAL LOW (ref 150–440)
RBC: 4.02 MIL/uL (ref 3.80–5.20)
RDW: 14 % (ref 11.5–14.5)
WBC: 6.3 10*3/uL (ref 3.6–11.0)

## 2017-05-01 LAB — COMPREHENSIVE METABOLIC PANEL
ALK PHOS: 1264 U/L — AB (ref 38–126)
ALT: 8 U/L — ABNORMAL LOW (ref 14–54)
ANION GAP: 8 (ref 5–15)
AST: 27 U/L (ref 15–41)
Albumin: 2.8 g/dL — ABNORMAL LOW (ref 3.5–5.0)
BILIRUBIN TOTAL: 0.4 mg/dL (ref 0.3–1.2)
BUN: 16 mg/dL (ref 6–20)
CALCIUM: 9.3 mg/dL (ref 8.9–10.3)
CO2: 21 mmol/L — ABNORMAL LOW (ref 22–32)
CREATININE: 0.61 mg/dL (ref 0.44–1.00)
Chloride: 109 mmol/L (ref 101–111)
Glucose, Bld: 99 mg/dL (ref 65–99)
Potassium: 3.9 mmol/L (ref 3.5–5.1)
SODIUM: 138 mmol/L (ref 135–145)
TOTAL PROTEIN: 6.3 g/dL — AB (ref 6.5–8.1)

## 2017-05-01 LAB — TYPE AND SCREEN
ABO/RH(D): A POS
ANTIBODY SCREEN: NEGATIVE

## 2017-05-01 MED ORDER — OXYTOCIN 40 UNITS IN LACTATED RINGERS INFUSION - SIMPLE MED
INTRAVENOUS | Status: AC
  Administered 2017-05-01: 07:00:00 via INTRAVENOUS
  Filled 2017-05-01: qty 1000

## 2017-05-01 MED ORDER — MEASLES, MUMPS & RUBELLA VAC ~~LOC~~ INJ
0.5000 mL | INJECTION | Freq: Once | SUBCUTANEOUS | Status: DC
  Filled 2017-05-01: qty 0.5

## 2017-05-01 MED ORDER — ONDANSETRON HCL 4 MG PO TABS: 4.0000 mg | ORAL_TABLET | ORAL | Status: DC | PRN

## 2017-05-01 MED ORDER — MAGNESIUM HYDROXIDE 400 MG/5ML PO SUSP: 30.0000 mL | ORAL | Status: DC | PRN

## 2017-05-01 MED ORDER — WITCH HAZEL-GLYCERIN EX PADS: 1.0000 "application " | MEDICATED_PAD | CUTANEOUS | Status: DC | PRN

## 2017-05-01 MED ORDER — SODIUM CHLORIDE 0.9 % IV SOLN
2.0000 g | Freq: Once | INTRAVENOUS | Status: AC
  Administered 2017-05-01: 2 g via INTRAVENOUS

## 2017-05-01 MED ORDER — OXYTOCIN 10 UNIT/ML IJ SOLN
INTRAMUSCULAR | Status: AC
  Filled 2017-05-01: qty 2

## 2017-05-01 MED ORDER — OXYTOCIN 40 UNITS IN LACTATED RINGERS INFUSION - SIMPLE MED
2.5000 [IU]/h | INTRAVENOUS | Status: DC
  Administered 2017-05-01: 2.5 [IU]/h via INTRAVENOUS

## 2017-05-01 MED ORDER — LIDOCAINE HCL (PF) 1 % IJ SOLN
INTRAMUSCULAR | Status: AC
  Filled 2017-05-01: qty 30

## 2017-05-01 MED ORDER — DIBUCAINE 1 % RE OINT: 1.0000 "application " | TOPICAL_OINTMENT | RECTAL | Status: DC | PRN

## 2017-05-01 MED ORDER — AMMONIA AROMATIC IN INHA
RESPIRATORY_TRACT | Status: AC
  Filled 2017-05-01: qty 10

## 2017-05-01 MED ORDER — SIMETHICONE 80 MG PO CHEW: 80.0000 mg | CHEWABLE_TABLET | ORAL | Status: DC | PRN

## 2017-05-01 MED ORDER — MISOPROSTOL 200 MCG PO TABS
ORAL_TABLET | ORAL | Status: AC
  Filled 2017-05-01: qty 4

## 2017-05-01 MED ORDER — ONDANSETRON HCL 4 MG/2ML IJ SOLN: 4.0000 mg | INTRAMUSCULAR | Status: DC | PRN

## 2017-05-01 MED ORDER — LACTATED RINGERS IV SOLN
500.0000 mL | INTRAVENOUS | Status: DC | PRN
Start: 2017-05-01 — End: 2017-05-03

## 2017-05-01 MED ORDER — BENZOCAINE-MENTHOL 20-0.5 % EX AERO: 1.0000 "application " | INHALATION_SPRAY | CUTANEOUS | Status: DC | PRN

## 2017-05-01 MED ORDER — FERROUS SULFATE 325 (65 FE) MG PO TABS
325.0000 mg | ORAL_TABLET | Freq: Two times a day (BID) | ORAL | Status: DC
  Administered 2017-05-01 – 2017-05-03 (×4): 325 mg via ORAL
  Filled 2017-05-01 (×4): qty 1

## 2017-05-01 MED ORDER — PRENATAL MULTIVITAMIN CH
1.0000 | ORAL_TABLET | Freq: Every day | ORAL | Status: DC
  Administered 2017-05-02: 1 via ORAL

## 2017-05-01 MED ORDER — OXYTOCIN 40 UNITS IN LACTATED RINGERS INFUSION - SIMPLE MED
999.0000 m[IU]/min | Freq: Once | INTRAVENOUS | Status: AC
Start: 2017-05-01 — End: 2017-05-01
  Administered 2017-05-01: 999 m[IU]/min via INTRAVENOUS

## 2017-05-01 MED ORDER — COCONUT OIL OIL: 1.0000 "application " | TOPICAL_OIL | Status: DC | PRN

## 2017-05-01 MED ORDER — ACETAMINOPHEN 325 MG PO TABS
650.0000 mg | ORAL_TABLET | ORAL | Status: DC | PRN
  Filled 2017-05-01 (×2): qty 2

## 2017-05-01 MED ORDER — DIPHENHYDRAMINE HCL 25 MG PO CAPS: 25.0000 mg | ORAL_CAPSULE | Freq: Four times a day (QID) | ORAL | Status: DC | PRN

## 2017-05-01 MED ORDER — ACETAMINOPHEN 325 MG PO TABS
650.0000 mg | ORAL_TABLET | ORAL | Status: DC | PRN
  Administered 2017-05-02 (×2): 650 mg via ORAL

## 2017-05-01 MED ORDER — LACTATED RINGERS IV SOLN: INTRAVENOUS | Status: DC

## 2017-05-01 MED ORDER — BUTORPHANOL TARTRATE 1 MG/ML IJ SOLN: 1.0000 mg | INTRAMUSCULAR | Status: DC | PRN

## 2017-05-01 MED ORDER — SENNOSIDES-DOCUSATE SODIUM 8.6-50 MG PO TABS
2.0000 | ORAL_TABLET | ORAL | Status: DC
  Administered 2017-05-02 (×2): 2 via ORAL
  Filled 2017-05-01 (×2): qty 2

## 2017-05-01 MED ORDER — IBUPROFEN 600 MG PO TABS
600.0000 mg | ORAL_TABLET | Freq: Four times a day (QID) | ORAL | Status: DC
  Administered 2017-05-01 – 2017-05-03 (×8): 600 mg via ORAL
  Filled 2017-05-01 (×8): qty 1

## 2017-05-01 MED ORDER — METHYLERGONOVINE MALEATE 0.2 MG/ML IJ SOLN
INTRAMUSCULAR | Status: AC
  Administered 2017-05-01: 07:00:00 via INTRAMUSCULAR
  Filled 2017-05-01: qty 1

## 2017-05-01 MED ORDER — ZOLPIDEM TARTRATE 5 MG PO TABS: 5.0000 mg | ORAL_TABLET | Freq: Every evening | ORAL | Status: DC | PRN

## 2017-05-01 MED ORDER — SODIUM CHLORIDE 0.9 % IV SOLN
INTRAVENOUS | Status: AC
  Filled 2017-05-01: qty 2000

## 2017-05-01 NOTE — H&P (Signed)
Robin Barr is a 36 y.o. feRobley Friesmale presenting for active labor . G5P4 at 40+5 weeks with AROM at 0330 and scheduled for induction today . Moved rapidly from 3cm to complete in 15 minutes . OB History    Gravida Para Term Preterm AB Living   5 4 1  0 0 4   SAB TAB Ectopic Multiple Live Births   0 0 0 0 4     Past Medical History:  Diagnosis Date  . Medical history non-contributory    Past Surgical History:  Procedure Laterality Date  . NO PAST SURGERIES     Family History: family history is not on file. Social History:  reports that she has quit smoking. She has never used smokeless tobacco. She reports that she does not drink alcohol or use drugs.     Maternal Diabetes: No Genetic Screening: Abnormal:  Results: Elevated risk of Trisomy 21  Cell free DNA - nl  Maternal Ultrasounds/Referrals: Normal Fetal Ultrasounds or other Referrals:  None Maternal Substance Abuse:  Yes:  Type: Other: ETOH use in first trimester Significant Maternal Medications:  None Significant Maternal Lab Results:  None Other Comments:  None  ROS History Dilation: 4 Effacement (%): 90 Station: -2 Exam by:: gaccione Blood pressure 137/75, pulse 85, temperature 98.9 F (37.2 C), temperature source Oral, resp. rate 18, height 5\' 2"  (1.575 m), weight 73.5 kg (162 lb), last menstrual period 06/25/2016, unknown if currently breastfeeding. Exam Physical Exam  Lungs cta  cv rrr  Cx: C/C /vtx + 3  Prenatal labs: ABO, Rh: --/--/A POS (07/16 16100538) Antibody: NEG (07/16 0538) Rubella:  imm/ varicella imm  RPR: Nonreactive (04/27 0000)  HBsAg: Negative (06/29 0000)  HIV: Non-reactive (04/26 0000)  GBS: Positive (06/15 0000)   Assessment/Plan: Grandmultiparous  Precipitous labor GBS prophylaxis started    Ihor Austinhomas J Desma Wilkowski 05/01/2017, 6:43 AM

## 2017-05-02 LAB — CBC
HCT: 31.6 % — ABNORMAL LOW (ref 35.0–47.0)
HEMOGLOBIN: 11 g/dL — AB (ref 12.0–16.0)
MCH: 30.7 pg (ref 26.0–34.0)
MCHC: 35 g/dL (ref 32.0–36.0)
MCV: 87.7 fL (ref 80.0–100.0)
Platelets: 119 10*3/uL — ABNORMAL LOW (ref 150–440)
RBC: 3.6 MIL/uL — AB (ref 3.80–5.20)
RDW: 14.3 % (ref 11.5–14.5)
WBC: 6.6 10*3/uL (ref 3.6–11.0)

## 2017-05-02 NOTE — Progress Notes (Signed)
Post Partum Day 1 - pt seen with medical interpreter Subjective: no complaints, up ad lib, voiding and tolerating PO  Objective: Blood pressure 108/74, pulse 82, temperature 97.9 F (36.6 C), temperature source Oral, resp. rate 18, height 5\' 2"  (1.575 m), weight 162 lb (73.5 kg), last menstrual period 06/25/2016, SpO2 100 %, unknown if currently breastfeeding.  Physical Exam:  General: alert and cooperative Lochia: appropriate Uterine Fundus: firm DVT Evaluation: No evidence of DVT seen on physical exam.   Recent Labs  05/01/17 0539 05/02/17 0548  HGB 12.1 11.0*  HCT 35.2 31.6*    Assessment/Plan: Continue current care Bottle feeding Precipitous delivery without complete GBS ppx, and baby will stay for monitoring   LOS: 1 day   Christeen DouglasBethany Markeita Barr 05/02/2017, 8:36 AM

## 2017-05-03 MED ORDER — IBUPROFEN 600 MG PO TABS: 600.0000 mg | ORAL_TABLET | Freq: Four times a day (QID) | ORAL | 0 refills | Status: DC

## 2017-05-03 NOTE — Progress Notes (Signed)
Discharge to home.  To car via auxillary 

## 2017-05-03 NOTE — Discharge Summary (Addendum)
Physician Discharge Summary  Patient ID: Robin Barr MRN: [id number] DOB/AGE: 34-Nov-1982 34 y.o.  Admit date: 05/01/2017 Discharge date: 05/03/2017  Admission Diagnoses:active labor   Discharge Diagnoses:  Active Problems:   Pregnancy   Discharged Condition: good  Hospital Course: uncomplicated precipitous delivery   Consults: None  Significant Diagnostic Studies: labs:  Results for orders placed or performed during the hospital encounter of 05/01/17 (from the past 72 hour(s))  Type and screen [facility]     Status: None   Collection Time: 05/01/17  5:38 AM  Result Value Ref Range   ABO/RH(D) A POS    Antibody Screen NEG    Sample Expiration 05/04/2017   CBC     Status: Abnormal   Collection Time: 05/01/17  5:39 AM  Result Value Ref Range   WBC 6.3 3.6 - 11.0 K/uL   RBC 4.02 3.80 - 5.20 MIL/uL   Hemoglobin 12.1 12.0 - 16.0 g/dL   HCT 35.2 35.0 - 47.0 %   MCV 87.5 80.0 - 100.0 fL   MCH 30.2 26.0 - 34.0 pg   MCHC 34.5 32.0 - 36.0 g/dL   RDW 14.0 11.5 - 14.5 %   Platelets 123 (L) 150 - 440 K/uL  Comprehensive metabolic panel     Status: Abnormal   Collection Time: 05/01/17  5:39 AM  Result Value Ref Range   Sodium 138 135 - 145 mmol/L   Potassium 3.9 3.5 - 5.1 mmol/L   Chloride 109 101 - 111 mmol/L   CO2 21 (L) 22 - 32 mmol/L   Glucose, Bld 99 65 - 99 mg/dL   BUN 16 6 - 20 mg/dL   Creatinine, Ser 0.61 0.44 - 1.00 mg/dL   Calcium 9.3 8.9 - 10.3 mg/dL   Total Protein 6.3 (L) 6.5 - 8.1 g/dL   Albumin 2.8 (L) 3.5 - 5.0 g/dL   AST 27 15 - 41 U/L   ALT 8 (L) 14 - 54 U/L   Alkaline Phosphatase 1,264 (H) 38 - 126 U/L   Total Bilirubin 0.4 0.3 - 1.2 mg/dL   GFR calc non Af Amer >60 >60 mL/min   GFR calc Af Amer >60 >60 mL/min    Comment: (NOTE) The eGFR has been calculated using the CKD EPI equation. This calculation has not been validated in all clinical situations. eGFR's persistently <60 mL/min signify possible Chronic  Kidney Disease.    Anion gap 8 5 - 15  CBC     Status: Abnormal   Collection Time: 05/02/17  5:48 AM  Result Value Ref Range   WBC 6.6 3.6 - 11.0 K/uL   RBC 3.60 (L) 3.80 - 5.20 MIL/uL   Hemoglobin 11.0 (L) 12.0 - 16.0 g/dL   HCT 31.6 (L) 35.0 - 47.0 %   MCV 87.7 80.0 - 100.0 fL   MCH 30.7 26.0 - 34.0 pg   MCHC 35.0 32.0 - 36.0 g/dL   RDW 14.3 11.5 - 14.5 %   Platelets 119 (L) 150 - 440 K/uL    Treatments: SVD  Discharge Exam: Blood pressure 106/62, pulse 85, temperature 98 F (36.7 C), temperature source Oral, resp. rate 18, height '5\' 2"'  (1.575 m), weight 73.5 kg (162 lb), last menstrual period 06/25/2016, SpO2 100 %, unknown if currently breastfeeding. General appearance: alert and cooperative Head: Normocephalic, without obvious abnormality, atraumatic Lungs CTA  CV RRR AAbd soft nt  UTX NT   Disposition: 01-Home or Self Care  Discharge Instructions    Call MD  for:  difficulty breathing, headache or visual disturbances    Complete by:  As directed    Call MD for:  extreme fatigue    Complete by:  As directed    Call MD for:  hives    Complete by:  As directed    Call MD for:  persistant dizziness or light-headedness    Complete by:  As directed    Call MD for:  persistant nausea and vomiting    Complete by:  As directed    Call MD for:  redness, tenderness, or signs of infection (pain, swelling, redness, odor or green/yellow discharge around incision site)    Complete by:  As directed    Call MD for:  severe uncontrolled pain    Complete by:  As directed    Call MD for:  temperature >100.4    Complete by:  As directed    Diet - low sodium heart healthy    Complete by:  As directed      6 week f/up [facility]  Nexplanon for contraception  Follow-up Information    Department, [facility] Follow up in 6 week(s).   Contact information: [place] [id number] [phone]           Signed: Gwen Her  [name] 05/03/2017, 8:57 AM

## 2017-05-03 NOTE — Progress Notes (Signed)
Discharge inst reviewed with mom via [facility]

## 2017-06-12 LAB — HM HIV SCREENING LAB: HM HIV Screening: NEGATIVE

## 2018-10-13 ENCOUNTER — Emergency Department
Admission: EM | Admit: 2018-10-13 | Discharge: 2018-10-13 | Disposition: A | Discharge status: Home / Self Care | Payer: Self-pay | Attending: Emergency Medicine | Admitting: Emergency Medicine

## 2018-10-13 ENCOUNTER — Emergency Department: Payer: Self-pay

## 2018-10-13 ENCOUNTER — Other Ambulatory Visit: Payer: Self-pay

## 2018-10-13 ENCOUNTER — Encounter: Payer: Self-pay | Admitting: Emergency Medicine

## 2018-10-13 DIAGNOSIS — F419 Anxiety disorder, unspecified: Secondary | ICD-10-CM | POA: Insufficient documentation

## 2018-10-13 DIAGNOSIS — Z87891 Personal history of nicotine dependence: Secondary | ICD-10-CM | POA: Insufficient documentation

## 2018-10-13 DIAGNOSIS — Z79899 Other long term (current) drug therapy: Secondary | ICD-10-CM | POA: Insufficient documentation

## 2018-10-13 DIAGNOSIS — R42 Dizziness and giddiness: Secondary | ICD-10-CM | POA: Insufficient documentation

## 2018-10-13 DIAGNOSIS — R0602 Shortness of breath: Secondary | ICD-10-CM

## 2018-10-13 DIAGNOSIS — R000 Tachycardia, unspecified: Secondary | ICD-10-CM | POA: Insufficient documentation

## 2018-10-13 LAB — BASIC METABOLIC PANEL
Anion gap: 15 (ref 5–15)
BUN: 14 mg/dL (ref 6–20)
CHLORIDE: 102 mmol/L (ref 98–111)
CO2: 17 mmol/L — AB (ref 22–32)
CREATININE: 0.59 mg/dL (ref 0.44–1.00)
Calcium: 9.9 mg/dL (ref 8.9–10.3)
GFR calc Af Amer: >60 mL/min (ref >60)
GFR calc non Af Amer: >60 mL/min (ref >60)
GLUCOSE: 162 mg/dL — AB (ref 70–99)
POTASSIUM: 3.4 mmol/L — AB (ref 3.5–5.1)
SODIUM: 134 mmol/L — AB (ref 135–145)

## 2018-10-13 LAB — CBC WITH DIFFERENTIAL/PLATELET
ABS IMMATURE GRANULOCYTES: 0.06 10*3/uL (ref 0.00–0.07)
Basophils Absolute: 0 10*3/uL (ref 0.0–0.1)
Basophils Relative: 0 %
Eosinophils Absolute: 0 10*3/uL (ref 0.0–0.5)
Eosinophils Relative: 0 %
HCT: 41.5 % (ref 36.0–46.0)
HEMOGLOBIN: 14.1 g/dL (ref 12.0–15.0)
Immature Granulocytes: 1 %
LYMPHS PCT: 16 %
Lymphs Abs: 1.8 10*3/uL (ref 0.7–4.0)
MCH: 29.1 pg (ref 26.0–34.0)
MCHC: 34 g/dL (ref 30.0–36.0)
MCV: 85.6 fL (ref 80.0–100.0)
MONO ABS: 0.6 10*3/uL (ref 0.1–1.0)
Monocytes Relative: 6 %
NEUTROS ABS: 8.3 10*3/uL — AB (ref 1.7–7.7)
Neutrophils Relative %: 77 %
Platelets: 217 10*3/uL (ref 150–400)
RBC: 4.85 MIL/uL (ref 3.87–5.11)
RDW: 12.8 % (ref 11.5–15.5)
WBC: 10.7 10*3/uL — AB (ref 4.0–10.5)
nRBC: 0 % (ref 0.0–0.2)

## 2018-10-13 LAB — TSH: TSH: 6.971 u[IU]/mL — ABNORMAL HIGH (ref 0.350–4.500)

## 2018-10-13 LAB — INFLUENZA PANEL BY PCR (TYPE A & B)
INFLAPCR: NEGATIVE
Influenza B By PCR: NEGATIVE

## 2018-10-13 LAB — TROPONIN I: Troponin I: <0.03 ng/mL (ref <0.03)

## 2018-10-13 LAB — T4, FREE: Free T4: 0.88 ng/dL (ref 0.82–1.77)

## 2018-10-13 LAB — I-STAT BETA HCG BLOOD, ED (NOT ORDERABLE)

## 2018-10-13 MED ORDER — SODIUM CHLORIDE 0.9 % IV BOLUS
1000.0000 mL | Freq: Once | INTRAVENOUS | Status: AC
  Administered 2018-10-13: 1000 mL via INTRAVENOUS

## 2018-10-13 MED ORDER — IOPAMIDOL (ISOVUE-370) INJECTION 76%
75.0000 mL | Freq: Once | INTRAVENOUS | Status: AC | PRN
  Administered 2018-10-13: 75 mL via INTRAVENOUS

## 2018-10-13 MED ORDER — LORAZEPAM 2 MG/ML IJ SOLN
0.5000 mg | Freq: Once | INTRAMUSCULAR | Status: AC
  Administered 2018-10-13: 0.5 mg via INTRAVENOUS
  Filled 2018-10-13: qty 1

## 2018-10-13 NOTE — ED Notes (Signed)
Pt denies chest pain states right arm hurts now

## 2018-10-13 NOTE — ED Notes (Signed)
Patient transported to X-ray 

## 2018-10-13 NOTE — Discharge Instructions (Signed)
Haga un seguimiento con su mdico maana para una reevaluacin. Beba muchos lquidos y descanse. Regrese a la sala de emergencias si tiene dolor en el pecho, dificultad para respirar, fiebre, si se desmaya o si desarrolla otros sntomas relacionados con usted.   Follow up with your doctor tomorrow for re-evaluation. Drink plenty of fluids and rest. Return to the ER if you have chest pain, shortness of breath, fever, if you pass out or if you develop any other symptoms concerning to you.

## 2018-10-13 NOTE — ED Provider Notes (Addendum)
[facility] Emergency Department Provider Note  ____________________________________________  Time seen: Approximately 11:15 AM  I have reviewed the triage vital signs and the nursing notes.   HISTORY  Chief Complaint Palpitations and Shortness of Breath   HPI Robin Barr is a 37 y.o. female no significant past medical history who presents for evaluation of shortness of breath and dizziness.  Patient reports that she was in her usual state of health at this morning she woke up. She then started to feel lightheaded and developed sudden onset of shortness of breath.  Had a mild dry cough which has now resolved.  She became very anxious and according to boyfriend she was hyperventilating and very agitated.  She noted that her feet and hands were turning red.  She felt her heart going really fast.  She denies any history of anxiety or panic attacks however she does report feeling anxious when she could not breathe. She reports family history of anxiety.  She reports feeling slightly nervous at this time and she still complains of mild shortness of breath.  She denies personal or family history of blood clots, recent travel immobilization, leg pain or swelling, hemoptysis, or history of cancer.  She does use Nexplanon for birth control.  She denies fever, chills, sore throat, congestion, chest pain, abdominal pain, nausea, vomiting, diarrhea or dysuria.  She denies history of asthma COPD.  She is a former smoker.   Past Medical History:  Diagnosis Date  . Medical history non-contributory     Patient Active Problem List   Diagnosis Date Noted  . Pregnancy 05/01/2017  . Precipitous delivery 05/22/2015    Past Surgical History:  Procedure Laterality Date  . NO PAST SURGERIES      Prior to Admission medications   Medication Sig Start Date End Date Taking? Authorizing Provider  ibuprofen (ADVIL,MOTRIN) 600 MG tablet Take 1 tablet (600 mg total) by mouth  every 6 (six) hours. 05/03/17   Schermerhorn, Ihor Austinhomas J, MD  Prenatal Multivit-Min-Fe-FA (PRENATAL VITAMINS PO) Take 1 tablet by mouth daily.    [provider]    Allergies Patient has no known allergies.  FH Brother - anxiety   Social History Social History   Tobacco Use  . Smoking status: Former Games developermoker  . Smokeless tobacco: Never Used  Substance Use Topics  . Alcohol use: No    Alcohol/week: 0.0 standard drinks  . Drug use: No    Review of Systems  Constitutional: Negative for fever. + anxiety and lightheadedness Eyes: Negative for visual changes. ENT: Negative for sore throat. Neck: No neck pain  Cardiovascular: Negative for chest pain. + palpitations Respiratory: + shortness of breath and cough Gastrointestinal: Negative for abdominal pain, vomiting or diarrhea. Genitourinary: Negative for dysuria. Musculoskeletal: Negative for back pain. Skin: Negative for rash. Neurological: Negative for headaches, weakness or numbness. Psych: No SI or HI  ____________________________________________   PHYSICAL EXAM:  VITAL SIGNS: ED Triage Vitals [10/13/18 1023]  Enc Vitals Group     BP (!) 143/87     Pulse Rate (!) 149     Resp (!) 22     Temp 98.3 F (36.8 C)     Temp src      SpO2 100 %     Weight 165 lb (74.8 kg)     Height 5\' 4"  (1.626 m)     Head Circumference      Peak Flow      Pain Score 0  Pain Loc      Pain Edu?      Excl. in GC?     Constitutional: Alert and oriented. Well appearing and in no apparent distress. HEENT:      Head: Normocephalic and atraumatic.         Eyes: Conjunctivae are normal. Sclera is non-icteric.       Mouth/Throat: Mucous membranes are moist.       Neck: Supple with no signs of meningismus. Cardiovascular: Sinus tachycardia. No murmurs, gallops, or rubs. 2+ symmetrical distal pulses are present in all extremities. No JVD. Respiratory: Normal respiratory effort. Lungs are clear to auscultation bilaterally. No  wheezes, crackles, or rhonchi.  Gastrointestinal: Soft, non tender, and non distended with positive bowel sounds. No rebound or guarding. Genitourinary: No CVA tenderness. Musculoskeletal: Nontender with normal range of motion in all extremities. No edema, cyanosis, or erythema of extremities. Neurologic: Normal speech and language. Face is symmetric. Moving all extremities. No gross focal neurologic deficits are appreciated. Skin: Skin is warm, dry and intact. No rash noted. Psychiatric: Mood and affect are normal. Speech and behavior are normal.  ____________________________________________   LABS (all labs ordered are listed, but only abnormal results are displayed)  Labs Reviewed  CBC WITH DIFFERENTIAL/PLATELET - Abnormal; Notable for the following components:      Result Value   WBC 10.7 (*)    Neutro Abs 8.3 (*)    All other components within normal limits  BASIC METABOLIC PANEL - Abnormal; Notable for the following components:   Sodium 134 (*)    Potassium 3.4 (*)    CO2 17 (*)    Glucose, Bld 162 (*)    All other components within normal limits  TSH - Abnormal; Notable for the following components:   TSH 6.971 (*)    All other components within normal limits  TROPONIN I  INFLUENZA PANEL BY PCR (TYPE A & B)  T4, FREE  I-STAT BETA HCG BLOOD, ED ([facility], [facility], [facility] ONLY)  I-STAT BETA HCG BLOOD, ED (NOT ORDERABLE)   ____________________________________________  EKG  ED ECG REPORT I, Nita Sicklearolina Donye Campanelli, the attending physician, personally viewed and interpreted this ECG.  Sinus tachycardia, rate of 148, normal intervals, LAD, no STE or depressions. No prior for comparison ____________________________________________  RADIOLOGY  I have personally reviewed the images performed during this visit and I agree with the Radiologist's read.   Interpretation by Radiologist:  Dg Chest 2 View  Result Date: 10/13/2018 CLINICAL DATA:  Shortness of breath.  Palpitations. EXAM: CHEST  - 2 VIEW COMPARISON:  None. FINDINGS: The heart size and mediastinal contours are within normal limits. Both lungs are clear. The visualized skeletal structures are unremarkable. IMPRESSION: No active cardiopulmonary disease. Electronically Signed   By: Francene BoyersJames  Maxwell M.D.   On: 10/13/2018 11:30   Ct Angio Chest Pe W And/or Wo Contrast  Result Date: 10/13/2018 CLINICAL DATA:  Shortness of breath. Palpitations. EXAM: CT ANGIOGRAPHY CHEST WITH CONTRAST TECHNIQUE: Multidetector CT imaging of the chest was performed using the standard protocol during bolus administration of intravenous contrast. Multiplanar CT image reconstructions and MIPs were obtained to evaluate the vascular anatomy. CONTRAST:  75mL ISOVUE-370 IOPAMIDOL (ISOVUE-370) INJECTION 76% COMPARISON:  Chest x-ray dated 10/13/2018 FINDINGS: Cardiovascular: Satisfactory opacification of the pulmonary arteries to the segmental level. No evidence of pulmonary embolism. Normal heart size. No pericardial effusion. Mediastinum/Nodes: No enlarged mediastinal, hilar, or axillary lymph nodes. Thyroid gland, trachea, and esophagus demonstrate no significant findings. Lungs/Pleura: Lungs are clear. No pleural  effusion or pneumothorax. Upper Abdomen: No acute abnormality. Musculoskeletal: No chest wall abnormality. No acute or significant osseous findings. Review of the MIP images confirms the above findings. IMPRESSION: No evidence of pulmonary embolus. No acute abnormality seen in the chest. Electronically Signed   By: Francene Boyers M.D.   On: 10/13/2018 12:11      ____________________________________________   PROCEDURES  Procedure(s) performed: None Procedures Critical Care performed:  None ____________________________________________   INITIAL IMPRESSION / ASSESSMENT AND PLAN / ED COURSE  37 y.o. female no significant past medical history who presents for evaluation of sudden onset of lightheadedness, shortness of breath, cough, palpitations,  and anxiety.  Patient is well-appearing in no distress, does not look anxious at all but does report feeling nervous, she is tachycardic and tachypneic with a pulse in the 130s and respiratory rate in the low 20s.  Her lungs are clear to auscultation with good air movement and no wheezing or crackles.  High clinical suspicion for PE therefore we will send patient for CT angiogram of the chest.  Ddx PE, pericarditis, pericardial effusion, myocarditis, PNA, anxiety, dehydration, viral syndrome, influenza, anemia. Chest x-ray with no evidence of pneumothorax or pneumonia.  Patient has had no flulike symptoms and no fever but there has been several cases this year of fluid in young people without a fever therefore will check her for flu a and B.  Will give IV fluids for tachycardia.  Labs show no evidence of anemia or severe dehydration/ AKI. Mild leukocytosis with WBC 10.7. EKG showing sinus tachycardia with no evidence of ischemia or dysrhythmias.  Clinical Course as of Oct 13 1344  Sat Oct 13, 2018  1306 CT negative for PE, pericardial effusion, PNA, PTX. Flu negative. Thyroid studies pending to eval for thyroid storm. Tachycardia improved with IVF. Small dose of ativan given with no changes in tachycardia. Labs otherwise WNL.   [CV]  1334 TSH slightly elevated but normal free T4 which would not explain her tachycardia. Patient remains tachycardic although improved HR and reports that she feels nervous and anxious which could contributing to her tachycardia. Also with a cough she could have a viral syndrome. She reports full resolution of her SOB. Troponin negative therefore low suspicion of myocarditis, CTA with no evidence of pericardial effusion. Will dc home with [facility]. Discussed strict return precautions for CP, SOB, fever, syncope.    [CV]    Clinical Course User Index [CV] Don Perking [place], MD     As part of my medical decision making, I reviewed the following data  within the electronic MEDICAL RECORD NUMBER Nursing notes reviewed and incorporated, Labs reviewed , EKG interpreted , Old chart reviewed, Radiograph reviewed , Notes from prior ED visits and [place] Controlled Substance Database    Pertinent labs & imaging results that were available during my care of the patient were reviewed by me and considered in my medical decision making (see chart for details).    ____________________________________________   FINAL CLINICAL IMPRESSION(S) / ED DIAGNOSES  Final diagnoses:  Lightheadedness  SOB (shortness of breath)  Anxiety  Tachycardia      NEW MEDICATIONS STARTED DURING THIS VISIT:  ED Discharge Orders    None       Note:  This document was prepared using Dragon voice recognition software and may include unintentional dictation errors.    Don Perking, [place], MD 10/13/18 1344    Don Perking [place], MD 10/13/18 [phone]

## 2018-10-13 NOTE — ED Triage Notes (Signed)
First Nurse Note:  Arrives C/O cough and difficulty breathing this morning.  Patient is AAOx3.  Skin warm and dry. NAD

## 2018-10-13 NOTE — ED Triage Notes (Signed)
Pt to ED via POV with boy friend. Pt has been c/o palpitations and difficulty breathing this morning. Pt also states that her feet and her hands are turning purple. No discoloration noted on assessment. Per pt boy friend she was fine this morning then she started having a coughing spell and start c/o trouble breathing. Pt is tachycardic on assessment.

## 2019-01-03 ENCOUNTER — Other Ambulatory Visit: Payer: Self-pay

## 2019-01-03 ENCOUNTER — Encounter: Payer: Self-pay | Admitting: *Deleted

## 2019-01-03 ENCOUNTER — Emergency Department
Admission: EM | Admit: 2019-01-03 | Discharge: 2019-01-04 | Disposition: A | Discharge status: Home / Self Care | Payer: Self-pay | Attending: Emergency Medicine | Admitting: Emergency Medicine

## 2019-01-03 DIAGNOSIS — Z87891 Personal history of nicotine dependence: Secondary | ICD-10-CM | POA: Insufficient documentation

## 2019-01-03 DIAGNOSIS — R079 Chest pain, unspecified: Secondary | ICD-10-CM | POA: Insufficient documentation

## 2019-01-03 DIAGNOSIS — R55 Syncope and collapse: Secondary | ICD-10-CM | POA: Insufficient documentation

## 2019-01-03 DIAGNOSIS — R0789 Other chest pain: Secondary | ICD-10-CM

## 2019-01-03 MED ORDER — SODIUM CHLORIDE 0.9% FLUSH
3.0000 mL | Freq: Once | INTRAVENOUS | Status: AC
  Administered 2019-01-04: 3 mL via INTRAVENOUS

## 2019-01-03 NOTE — ED Triage Notes (Signed)
Pt report via translator that she felt weak at 1300 today and the chest pain did not started at approximately 2100.

## 2019-01-03 NOTE — ED Triage Notes (Signed)
Pt c/o chest pain since 1300 today. Near syncope episode w/o time frame. Pt presently in no acute distress. PT administered NTG 04 MG x 2, last 2303, ASA 324 MG administered en route. PIV #[id number] started by EMS.

## 2019-01-04 LAB — CBC
HCT: 39.5 % (ref 36.0–46.0)
Hemoglobin: 13 g/dL (ref 12.0–15.0)
MCH: 28.8 pg (ref 26.0–34.0)
MCHC: 32.9 g/dL (ref 30.0–36.0)
MCV: 87.4 fL (ref 80.0–100.0)
PLATELETS: 178 10*3/uL (ref 150–400)
RBC: 4.52 MIL/uL (ref 3.87–5.11)
RDW: 12.3 % (ref 11.5–15.5)
WBC: 6.6 10*3/uL (ref 4.0–10.5)
nRBC: 0 % (ref 0.0–0.2)

## 2019-01-04 LAB — URINALYSIS, COMPLETE (UACMP) WITH MICROSCOPIC
BILIRUBIN URINE: NEGATIVE
Bacteria, UA: NONE SEEN
GLUCOSE, UA: NEGATIVE mg/dL
Hgb urine dipstick: NEGATIVE
Ketones, ur: NEGATIVE mg/dL
LEUKOCYTE UA: NEGATIVE
Nitrite: NEGATIVE
Protein, ur: NEGATIVE mg/dL
Specific Gravity, Urine: 1.013 (ref 1.005–1.030)
pH: 6 (ref 5.0–8.0)

## 2019-01-04 LAB — BASIC METABOLIC PANEL
Anion gap: 8 (ref 5–15)
BUN: 19 mg/dL (ref 6–20)
CO2: 25 mmol/L (ref 22–32)
Calcium: 9.3 mg/dL (ref 8.9–10.3)
Chloride: 105 mmol/L (ref 98–111)
Creatinine, Ser: 1.21 mg/dL — ABNORMAL HIGH (ref 0.44–1.00)
GFR calc Af Amer: >60 mL/min (ref >60)
GFR calc non Af Amer: 58 mL/min — ABNORMAL LOW (ref >60)
Glucose, Bld: 121 mg/dL — ABNORMAL HIGH (ref 70–99)
Potassium: 4.1 mmol/L (ref 3.5–5.1)
SODIUM: 138 mmol/L (ref 135–145)

## 2019-01-04 LAB — POCT PREGNANCY, URINE: Preg Test, Ur: NEGATIVE

## 2019-01-04 LAB — TROPONIN I
Troponin I: <0.03 ng/mL (ref <0.03)
Troponin I: <0.03 ng/mL (ref <0.03)

## 2019-01-04 LAB — FIBRIN DERIVATIVES D-DIMER (ARMC ONLY): Fibrin derivatives D-dimer (ARMC): 118.79 ng/mL (FEU) (ref 0.00–499.00)

## 2019-01-04 MED ORDER — SODIUM CHLORIDE 0.9 % IV BOLUS
1000.0000 mL | Freq: Once | INTRAVENOUS | Status: AC
  Administered 2019-01-04: 1000 mL via INTRAVENOUS

## 2019-01-04 NOTE — ED Provider Notes (Signed)
[facility] Emergency Department Provider Note  ____________________________________________   First MD Initiated Contact with Patient 01/03/19 2351     (approximate)  I have reviewed the triage vital signs and the nursing notes.   HISTORY  Chief Complaint Chest Pain and Near Syncope  The patient and/or family speak(s) Spanish.  They understand they have the right to the use of a hospital interpreter, however at this time they prefer to speak directly with me in Spanish.  They know that they can ask for an interpreter at any time.   HPI Robin Barr is a 38 y.o. female with no chronic medical issues who presents for evaluation of some general malaise earlier today but then she awoke tonight with some mild but sharp chest pain the left side of her chest and some pain in her left arm.  She got up to move around and she felt lightheaded and dizzy and had one episode of vomiting.  She received a full dose aspirin in route to the hospital and had a fluid bolus as well as 2 nitroglycerin.  She felt better after the fluids but I do not have a record of how much fluid she received.  The patient currently is asymptomatic but said her shoulder is still little bit sore.  She does not believe that she slept on that side and think she was sleeping on the other side when she awoke with the pain.  She does not typically have this kind of pain but she did have one episode about 3 months ago that she says was much worse and associated with shortness of breath.  She felt a little bit short of breath a few hours ago when she had the symptoms but that has completely resolved.  She said that earlier this afternoon she just felt bad all over in general but was not having pain.  She fell like she almost passed out this evening when she had her symptoms.  Nothing in particular made them better except possibly the IV fluids and nothing particular made them worse.  She has had no  swelling in her legs and has no history of blood clots in her legs or her lungs.  She does have an implanted birth control device in her left arm.  She has no history of cardiac problems.   She has not been doing any travel recently and has not come into contact with anyone known to have COVID-19 nor any high-risk exposures.  She denies fever/chills and sore throat.        Past Medical History:  Diagnosis Date  . Medical history non-contributory     Patient Active Problem List   Diagnosis Date Noted  . Pregnancy 05/01/2017  . Precipitous delivery 05/22/2015    Past Surgical History:  Procedure Laterality Date  . NO PAST SURGERIES      Prior to Admission medications   Medication Sig Start Date End Date Taking? Authorizing Provider  ibuprofen (ADVIL,MOTRIN) 600 MG tablet Take 1 tablet (600 mg total) by mouth every 6 (six) hours. 05/03/17   Schermerhorn, Ihor Austin, MD  Prenatal Multivit-Min-Fe-FA (PRENATAL VITAMINS PO) Take 1 tablet by mouth daily.    [provider]    Allergies Patient has no known allergies.  History reviewed. No pertinent family history.  Social History Social History   Tobacco Use  . Smoking status: Former Games developer  . Smokeless tobacco: Never Used  Substance Use Topics  . Alcohol use: No  Alcohol/week: 0.0 standard drinks  . Drug use: No    Review of Systems Constitutional: No fever/chills Eyes: No visual changes. ENT: No sore throat. Cardiovascular: +chest pain, now resolved.  Near syncope. Respiratory: +shortness of breath, now resolved Gastrointestinal: No abdominal pain.  One episode of nausea and vomiting tonight.  No diarrhea.  No constipation. Genitourinary: Negative for dysuria. Musculoskeletal: Negative for neck pain.  Negative for back pain. Integumentary: Negative for rash. Neurological: Negative for headaches, focal weakness or numbness.   ____________________________________________   PHYSICAL EXAM:  VITAL SIGNS:  ED Triage Vitals  Enc Vitals Group     BP 01/03/19 2328 123/78     Pulse Rate 01/03/19 2328 91     Resp 01/03/19 2328 18     Temp 01/03/19 2328 98.7 F (37.1 C)     Temp Source 01/03/19 2328 Oral     SpO2 01/03/19 2320 98 %     Weight 01/03/19 2337 63.5 kg (140 lb)     Height 01/03/19 2337 1.524 m (5')     Head Circumference --      Peak Flow --      Pain Score 01/03/19 2329 0     Pain Loc --      Pain Edu? --      Excl. in GC? --     Constitutional: Alert and oriented. Well appearing and in no acute distress. Eyes: Conjunctivae are normal.  Head: Atraumatic. Nose: No congestion/rhinnorhea. Mouth/Throat: Mucous membranes are moist. Neck: No stridor.  No meningeal signs.   Cardiovascular: Normal rate, regular rhythm. Good peripheral circulation. Grossly normal heart sounds. Respiratory: Normal respiratory effort.  No retractions. Lungs CTAB. Gastrointestinal: Soft and nontender. No distention.  Musculoskeletal: No lower extremity tenderness nor edema. No gross deformities of extremities. Neurologic:  Normal speech and language. No gross focal neurologic deficits are appreciated.  Skin:  Skin is warm, dry and intact. No rash noted. Psychiatric: Mood and affect are normal. Speech and behavior are normal.  ____________________________________________   LABS (all labs ordered are listed, but only abnormal results are displayed)  Labs Reviewed  BASIC METABOLIC PANEL - Abnormal; Notable for the following components:      Result Value   Glucose, Bld 121 (*)    Creatinine, Ser 1.21 (*)    GFR calc non Af Amer 58 (*)    All other components within normal limits  CBC  TROPONIN I  FIBRIN DERIVATIVES D-DIMER ([facility] ONLY)  TROPONIN I  URINALYSIS, COMPLETE (UACMP) WITH MICROSCOPIC  CBG MONITORING, ED  POC URINE PREG, ED  POCT PREGNANCY, URINE   ____________________________________________  EKG  ED ECG REPORT I, Loleta Rose, the attending physician, personally viewed and  interpreted this ECG.  Date: 01/03/2019 EKG Time: 23: 20 Rate: 94 Rhythm: normal sinus rhythm QRS Axis: Borderline left axis deviation Intervals: normal ST/T Wave abnormalities: Early repolarization with very slight ST elevation but no obvious signs of ischemia Narrative Interpretation: no definitive evidence of acute ischemia; does not meet STEMI criteria.   ____________________________________________  RADIOLOGY   ED MD interpretation:  No indication for CXR  Official radiology report(s): No results found.  ____________________________________________   PROCEDURES   Procedure(s) performed (including Critical Care):  Procedures   ____________________________________________   INITIAL IMPRESSION / MDM / ASSESSMENT AND PLAN / ED COURSE  As part of my medical decision making, I reviewed the following data within the electronic MEDICAL RECORD NUMBER Nursing notes reviewed and incorporated, Labs reviewed , EKG interpreted , Old chart reviewed  and Notes from prior ED visits         Differential diagnosis includes, but is not limited to, vasovagal syncope, musculoskeletal chest pain, PE, pneumonia or nonspecific viral syndrome, cardiac arrhythmia, complications of pregnancy, ACS less likely.  The patient is well-appearing and in no distress.  Her symptoms, although occurring when she woke up from sleep, have completely resolved although she did receive a number of ACS oriented treatments by paramedics.  Based on her episode of nausea and vomiting associated with the other symptoms and the feeling of near syncope, I suspect this was vasovagal.  She is low risk for ACS and low risk for PE, but her heart rate is hovering right around 100 and she uses exogenous hormones in the form of her implanted birth control.  I do not have a better explanation for a young woman having some mild tachycardia, chest pain, shortness of breath, all of which occurred during sleep, so I will check a d-dimer  to try to further risk stratify and rule out pulmonary embolism.  I will also check a second troponin after the first 1 comes back normal, which I anticipate.  Her basic metabolic panel is essentially normal except for a slightly elevated creatinine of 1.2.  CBC is normal, urine pregnancy test is negative, urinalysis is pending.  Patient understands and agrees with the plan.  Her vital signs remained stable except for the borderline tachycardia.  I have ordered 1 L of fluids because I think this may be volume related.  Clinical Course as of Jan 03 413  Fri Jan 04, 2019  0121 Correction: I misunderstood and the patient did not receive a fluid bolus by EMS, that was actually another patient.  That further suggest that my order for 1 L normal saline IV bolus while she is being worked up is appropriate given the slightly elevated creatinine and the probability that this is related to her depleted volume.   [CF]  0407 D-dimer was within normal limits and second troponin was negative.  She has been asymptomatic since the event and states she feels better.  The second troponin was drawn more than 4 hours since the event occurred even though it was not 4 hours since the first troponin.  She is comfortable with plan for discharge and outpatient follow-up.  I gave my usual and customary return precautions.   [CF]    Clinical Course User Index [CF] Loleta Rose, MD    ____________________________________________  FINAL CLINICAL IMPRESSION(S) / ED DIAGNOSES  Final diagnoses:  Atypical chest pain  Near syncope     MEDICATIONS GIVEN DURING THIS VISIT:  Medications  sodium chloride flush (NS) 0.9 % injection 3 mL (3 mLs Intravenous Given 01/04/19 0129)  sodium chloride 0.9 % bolus 1,000 mL (0 mLs Intravenous Stopped 01/04/19 0234)     ED Discharge Orders    None       Note:  This document was prepared using Dragon voice recognition software and may include unintentional dictation errors.    Loleta Rose, MD 01/04/19 [phone]

## 2019-01-04 NOTE — Discharge Instructions (Signed)
Your workup in the Emergency Department today was reassuring.  We did not find any specific abnormalities.  We recommend you drink plenty of fluids, take your regular medications and/or any new ones prescribed today, and follow up with the doctor(s) listed in these documents as recommended.  Return to the Emergency Department if you develop new or worsening symptoms that concern you.  

## 2019-01-04 NOTE — ED Notes (Signed)
Hospitalist @ [facility]. Waiting to transport until admitting evaluation complete.

## 2019-04-29 ENCOUNTER — Telehealth: Payer: Self-pay | Admitting: Family Medicine

## 2019-04-29 NOTE — Telephone Encounter (Signed)
Per Clance Boll, RN Supervisor, pt may not be an [facility] resident since address is [address] and she is not one of the pts we have followed for Covid 19. Pt must be without fever for 3 days (starting at day 0) and at least 10 days from onset of symptoms.

## 2019-04-29 NOTE — Telephone Encounter (Signed)
Wants nexplanon removed 

## 2019-04-29 NOTE — Telephone Encounter (Signed)
Clance Boll, RN Supervisor is checking into matter and will advise.

## 2019-04-29 NOTE — Telephone Encounter (Signed)
Phone call to pt with interpreter. C/O pain in the arm, pain started about 4 days ago. Pt states it will be almost 3 years that she has had it. Pt was told by several people she will start having problems with it now. Pt denies hitting arm on anything or any injuries to site. Pt also states she is having headaches that started about 4 days ago. Last sex was 04/26/2019 without condom. Pt desires IUD, unsure of kind she wants. Pt counseled that sperm can live in her body up to 7 days after sex. Pt counseled that since she is not wanting to become pregnant it is best to abstain from sex, but at minimum use condoms with all sex until procedure appt. Pt states she tested positive for Covid 19 in mid June. Pt states she is still having fever (confirmed by interpreter), then after asking a few more questions about Covid, pt stated she did not have fever. Pt stated headaches and pain in arm started about 4 days ago; nausea started yesterday. Her kids were tested for Covid in middle of June, came out positive. Pt states she was cleared by Covid team and no longer has to be isolated. Pt states no one working with Covid is aware of her symptoms that started this week. Told pt someone would be calling to talk with her further.

## 2019-05-06 ENCOUNTER — Emergency Department
Admission: EM | Admit: 2019-05-06 | Discharge: 2019-05-06 | Disposition: A | Discharge status: Home / Self Care | Payer: HRSA Program | Attending: Emergency Medicine | Admitting: Emergency Medicine

## 2019-05-06 ENCOUNTER — Other Ambulatory Visit: Payer: Self-pay

## 2019-05-06 DIAGNOSIS — R51 Headache: Secondary | ICD-10-CM | POA: Diagnosis present

## 2019-05-06 DIAGNOSIS — R221 Localized swelling, mass and lump, neck: Secondary | ICD-10-CM | POA: Insufficient documentation

## 2019-05-06 DIAGNOSIS — U071 COVID-19: Secondary | ICD-10-CM | POA: Insufficient documentation

## 2019-05-06 DIAGNOSIS — G44209 Tension-type headache, unspecified, not intractable: Secondary | ICD-10-CM

## 2019-05-06 DIAGNOSIS — Z87891 Personal history of nicotine dependence: Secondary | ICD-10-CM | POA: Diagnosis not present

## 2019-05-06 DIAGNOSIS — R200 Anesthesia of skin: Secondary | ICD-10-CM | POA: Insufficient documentation

## 2019-05-06 DIAGNOSIS — M25512 Pain in left shoulder: Secondary | ICD-10-CM

## 2019-05-06 DIAGNOSIS — Z711 Person with feared health complaint in whom no diagnosis is made: Secondary | ICD-10-CM

## 2019-05-06 LAB — GLUCOSE, CAPILLARY: Glucose-Capillary: 102 mg/dL — ABNORMAL HIGH (ref 70–99)

## 2019-05-06 LAB — TSH: TSH: 2.55 u[IU]/mL (ref 0.350–4.500)

## 2019-05-06 LAB — GROUP A STREP BY PCR: Group A Strep by PCR: NOT DETECTED

## 2019-05-06 MED ORDER — ACETAMINOPHEN 500 MG PO TABS: 500.0000 mg | ORAL_TABLET | Freq: Four times a day (QID) | ORAL | 0 refills | Status: DC | PRN

## 2019-05-06 MED ORDER — KETOROLAC TROMETHAMINE 30 MG/ML IJ SOLN
30.0000 mg | Freq: Once | INTRAMUSCULAR | Status: AC
  Administered 2019-05-06: 12:00:00 30 mg via INTRAMUSCULAR
  Filled 2019-05-06: qty 1

## 2019-05-06 MED ORDER — CYCLOBENZAPRINE HCL 5 MG PO TABS: ORAL_TABLET | ORAL | 0 refills | Status: DC

## 2019-05-06 NOTE — ED Triage Notes (Signed)
Fatigue, headache and sore throat X 8 days. Pt alert and oriented X4, active, cooperative, pt in NAD. RR even and unlabored, color WNL.

## 2019-05-06 NOTE — ED Notes (Signed)
See triage note  Presents with sore throat for several days  Also has had headache  Unsure of fever

## 2019-05-06 NOTE — ED Notes (Addendum)
Interpreter requested 

## 2019-05-06 NOTE — ED Provider Notes (Signed)
[facility] Emergency Department Provider Note  ____________________________________________  Time seen: Approximately 12:04 PM  I have reviewed the triage vital signs and the nursing notes.   HISTORY  Chief Complaint Sore Throat, Headache, and Fatigue    HPI Robin Barr is a 38 y.o. female that presents to the emergency department for evaluation of headache, left shoulder pain, lump on the outside of neck for 8 days.  Patient occasionally has nasal congestion, worse at night.  She vomited once 8 days ago.  Patient has had difficulty laying on her left shoulder due to pain in the shoulder. She occasionally has bilateral hand numbness. Hand numbness has been going on for "a while."  She states that when she feels the outside of her neck in the front, near her chest, she can feel a lump.  She does not have any difficulty swallowing or pain with swallowing.  She does not feel a lump when swallowing.  She only feels these lump when she touches her neck.  Patient's mother and other family members have thyroid problems and is concerned for this.  She does not have a primary care provider.  She had COVID 1 month ago and symptoms have resolved. She lives with her children, who are not sick.   No fever, chills, sore throat, cough, shortness of breath, chest pain, abdominal pain, diarrhea.  Past Medical History:  Diagnosis Date  . Medical history non-contributory     Patient Active Problem List   Diagnosis Date Noted  . Pregnancy 05/01/2017  . Precipitous delivery 05/22/2015    Past Surgical History:  Procedure Laterality Date  . NO PAST SURGERIES      Prior to Admission medications   Medication Sig Start Date End Date Taking? Authorizing Provider  acetaminophen (TYLENOL) 500 MG tablet Take 1 tablet (500 mg total) by mouth every 6 (six) hours as needed. 05/06/19   [place] DerryWagner, Niomi Valent, PA-C  cyclobenzaprine (FLEXERIL) 5 MG tablet Take 1-2 tablets 3 times daily as  needed 05/06/19   [place] DerryWagner, Stony Stegmann, PA-C    Allergies Patient has no known allergies.  No family history on file.  Social History Social History   Tobacco Use  . Smoking status: Former Games developermoker  . Smokeless tobacco: Never Used  Substance Use Topics  . Alcohol use: No    Alcohol/week: 0.0 standard drinks  . Drug use: No     Review of Systems  Constitutional: No fever/chills ENT: Nasal congestion at night. Cardiovascular: No chest pain. Respiratory: No cough.  No SOB. Gastrointestinal: No abdominal pain.  Vomiting times 1, 1 week ago. Musculoskeletal: Positive for shoulder pain. Skin: Negative for rash, abrasions, lacerations, ecchymosis. Neurological: Positive for headache.   ____________________________________________   PHYSICAL EXAM:  VITAL SIGNS: ED Triage Vitals  Enc Vitals Group     BP 05/06/19 0957 134/79     Pulse Rate 05/06/19 0957 91     Resp 05/06/19 0957 18     Temp 05/06/19 0957 98.3 F (36.8 C)     Temp Source 05/06/19 0957 Oral     SpO2 05/06/19 0957 98 %     Weight 05/06/19 0958 140 lb (63.5 kg)     Height 05/06/19 0958 5\' 2"  (1.575 m)     Head Circumference --      Peak Flow --      Pain Score 05/06/19 0958 7     Pain Loc --      Pain Edu? --      Excl.  in GC? --      Constitutional: Alert and oriented. Well appearing and in no acute distress. Eyes: Conjunctivae are normal. PERRL. EOMI. Head: Atraumatic. ENT:      Ears:      Nose: No congestion/rhinnorhea.      Mouth/Throat: Mucous membranes are moist.  Neck: No stridor.  No cervical spine tenderness to palpation.  No mass or lump palpated.  Patient points to her thyroid where she feels sensation. Cardiovascular: Normal rate, regular rhythm.  Good peripheral circulation.  Symmetric radial pulses bilaterally. Respiratory: Normal respiratory effort without tachypnea or retractions. Lungs CTAB. Good air entry to the bases with no decreased or absent breath sounds. Gastrointestinal: Bowel  sounds 4 quadrants. Soft and nontender to palpation. No guarding or rigidity. No palpable masses. No distention. No CVA tenderness. Musculoskeletal: Full range of motion to all extremities. No gross deformities appreciated.  Tenderness to palpation to right anterior shoulder.  Pain with range of motion of right shoulder.  Strength equal in upper extremities bilaterally.  Grip strength intact. Neurologic:  Normal speech and language. No gross focal neurologic deficits are appreciated.  Skin:  Skin is warm, dry and intact. No rash noted. Psychiatric: Mood and affect are normal. Speech and behavior are normal. Patient exhibits appropriate insight and judgement.   ____________________________________________   LABS (all labs ordered are listed, but only abnormal results are displayed)  Labs Reviewed  GLUCOSE, CAPILLARY - Abnormal; Notable for the following components:      Result Value   Glucose-Capillary 102 (*)    All other components within normal limits  GROUP A STREP BY PCR  NOVEL CORONAVIRUS, NAA (HOSPITAL ORDER, SEND-OUT TO REF LAB)  TSH   ____________________________________________  EKG   ____________________________________________  RADIOLOGY   No results found.  ____________________________________________    PROCEDURES  Procedure(s) performed:    Procedures    Medications  ketorolac (TORADOL) 30 MG/ML injection 30 mg (30 mg Intramuscular Given 05/06/19 1215)     ____________________________________________   INITIAL IMPRESSION / ASSESSMENT AND PLAN / ED COURSE  Pertinent labs & imaging results that were available during my care of the patient were reviewed by me and considered in my medical decision making (see chart for details).  Review of the [place] CSRS was performed in accordance of the [facility] prior to dispensing any controlled drugs.   Patient presented the emergency department for evaluation of multiple complaints.  Vital signs and exam are  reassuring.  Headache improved with Tylenol.  Shoulder pain is reproducible and likely musculoskeletal.  She was given Flexeril to help relax her muscles.  Patient can feel a lump when she touches where her thyroid is.  I am unable to feel this same mass.  Patient does not feel a lump with swallowing and does not have any difficulty with swallowing.  Her TSH is within normal limits.  She will follow-up with ENT for further evaluation and possible ultrasound.  COVID test was sent and is pending.  Patient will be discharged home with prescriptions for Flexeril and Tylenol. Patient is to follow up with primary care and ENT as directed. Patient is given ED precautions to return to the ED for any worsening or new symptoms.  Robin Barr was evaluated in Emergency Department on 05/06/2019 for the symptoms described in the history of present illness. She was evaluated in the context of the global COVID-19 pandemic, which necessitated consideration that the patient might be at risk for infection with the SARS-CoV-2 virus that causes  COVID-19. Institutional protocols and algorithms that pertain to the evaluation of patients at risk for COVID-19 are in a state of rapid change based on information released by regulatory bodies including the CDC and federal and state organizations. These policies and algorithms were followed during the patient's care in the ED.   ____________________________________________  FINAL CLINICAL IMPRESSION(S) / ED DIAGNOSES  Final diagnoses:  Acute pain of left shoulder  Acute non intractable tension-type headache  Feared complaint without diagnosis      NEW MEDICATIONS STARTED DURING THIS VISIT:  ED Discharge Orders         Ordered    acetaminophen (TYLENOL) 500 MG tablet  Every 6 hours PRN     05/06/19 1311    cyclobenzaprine (FLEXERIL) 5 MG tablet     05/06/19 1311              This chart was dictated using voice recognition software/Dragon. Despite best  efforts to proofread, errors can occur which can change the meaning. Any change was purely unintentional.    Laban Emperor, PA-C 05/06/19 1750    Lavonia Drafts, MD 05/08/19 1944

## 2019-05-06 NOTE — Discharge Instructions (Addendum)
Your thyroid blood test was within normal limits.  You may need some imaging of your thyroid through ENT or primary care.  Please call ENT for an appointment.  Your blood sugar was normal.  Your strep test was negative.  Please follow-up with primary care for recheck of symptoms.  I have given you some referrals.  You can take Tylenol for pain and Flexeril to help relax your muscles.  Your COVID test is pending and should have results in about 2 days.  Return to the emergency department for worsening of symptoms.

## 2019-05-07 ENCOUNTER — Telehealth: Payer: Self-pay | Admitting: Emergency Medicine

## 2019-05-07 NOTE — Telephone Encounter (Signed)
Called patient with pacific interpretter [phone] to inform patient of covid 19 result positive and assure she understands cdc guidelines.  Left message.

## 2019-05-08 LAB — NOVEL CORONAVIRUS, NAA (HOSP ORDER, SEND-OUT TO REF LAB; TAT 18-24 HRS): SARS-CoV-2, NAA: DETECTED — AB

## 2019-08-12 DIAGNOSIS — K219 Gastro-esophageal reflux disease without esophagitis: Secondary | ICD-10-CM | POA: Insufficient documentation

## 2020-07-06 DIAGNOSIS — A630 Anogenital (venereal) warts: Secondary | ICD-10-CM

## 2020-07-06 DIAGNOSIS — N6011 Diffuse cystic mastopathy of right breast: Secondary | ICD-10-CM

## 2020-07-07 ENCOUNTER — Ambulatory Visit (LOCAL_COMMUNITY_HEALTH_CENTER): Payer: Self-pay | Admitting: Physician Assistant

## 2020-07-07 ENCOUNTER — Encounter: Payer: Self-pay | Admitting: Physician Assistant

## 2020-07-07 ENCOUNTER — Other Ambulatory Visit: Payer: Self-pay

## 2020-07-07 ENCOUNTER — Ambulatory Visit: Payer: Self-pay

## 2020-07-07 VITALS — BP 102/63 | Ht 61.0 in | Wt 152.0 lb

## 2020-07-07 DIAGNOSIS — Z3009 Encounter for other general counseling and advice on contraception: Secondary | ICD-10-CM

## 2020-07-07 DIAGNOSIS — Z0000 Encounter for general adult medical examination without abnormal findings: Secondary | ICD-10-CM

## 2020-07-07 DIAGNOSIS — Z3046 Encounter for surveillance of implantable subdermal contraceptive: Secondary | ICD-10-CM

## 2020-07-07 NOTE — Progress Notes (Signed)
Pt scheduled to RTC for Pap smear for 07/27/2020 at 11:00am per pt request and per provider verbal order as pt is having menstrual bleeding today. Appt card given to pt and pt aware to arrive early for check-in. Pt accepts condoms today and reports plans to use condoms as BCM at this time. Pt aware to give [place] a call if she changes her mind and desires a hormonal BCM later on and pt states understanding. Counseled pt per provider orders and pt states understanding. Provider orders completed.

## 2020-07-07 NOTE — Progress Notes (Signed)
Pt is here for Physical and Nexplanon removal. Pt reports last sex was 07/04/2020 without condom. Counseled pt that sperm can live in the body for up to 7 days so that if they do the Nexplanon removal today it is a possibility that she could become pregnant from her last sex and pt states understanding and that she still wants the Nexplanon removed today. Pt unsure of what birth control method she would like to switch to. RN counseling for Nexplanon removal completed and consent form reviewed and signed by pt.

## 2020-07-10 ENCOUNTER — Encounter: Payer: Self-pay | Admitting: Physician Assistant

## 2020-07-10 NOTE — Progress Notes (Signed)
Family Planning Visit- Repeat Yearly Visit  Subjective:  Robin Barr is a 39 y.o. G5P1005  being seen today for an well woman visit and to discuss family planning options.    She is currently using Nexplanon for pregnancy prevention. Patient reports she does not  want a pregnancy in the next year. Patient  has Precipitous delivery; Pregnancy; Fibrocystic disease of breast; Condyloma; and Gastroesophageal reflux disease without esophagitis on their problem list.  Chief Complaint  Patient presents with  . Contraception    Physical and Nexplanon removal    Patient reports she is doing well but wants her Nexplanon removed.  Per chart review, patient is due for CBE and pap today.  Patient states that she is currently on her period and wants to defer her pap today.    Patient denies any concerns today.    See flowsheet for other program required questions.   Body mass index is 28.72 kg/m. - Patient is eligible for diabetes screening based on BMI and age >14?  not applicable HA1C ordered? not applicable  Patient reports 1partners in last year. Desires STI screening?  No - patient declines.   Has patient been screened once for HCV in the past?  No  No results found for: HCVAB  Does the patient have current of drug use, have a partner with drug use, and/or has been incarcerated since last result? No  If yes-- Screen for HCV through [facility]   Does the patient meet criteria for HBV testing? No  Criteria:  -Household, sexual or needle sharing contact with HBV -History of drug use -HIV positive -Those with known Hep C   Health Maintenance Due  Topic Date Due  . HEMOGLOBIN A1C  Never done  . Hepatitis C Screening  Never done  . PNEUMOCOCCAL POLYSACCHARIDE VACCINE AGE 106-64 HIGH RISK  Never done  . FOOT EXAM  Never done  . OPHTHALMOLOGY EXAM  Never done  . URINE MICROALBUMIN  Never done  . COVID-19 Vaccine (1) Never done  . TETANUS/TDAP  Never done  . PAP  SMEAR-Modifier  11/19/2018  . INFLUENZA VACCINE  Never done    Review of Systems  All other systems reviewed and are negative.   The following portions of the patient's history were reviewed and updated as appropriate: allergies, current medications, past family history, past medical history, past social history, past surgical history and problem list. Problem list updated.  Objective:   Vitals:   07/07/20 1102  BP: 102/63  Weight: 152 lb (68.9 kg)  Height: 5\' 1"  (1.549 m)    Physical Exam Vitals and nursing note reviewed.  Constitutional:      General: She is not in acute distress.    Appearance: Normal appearance.  HENT:     Head: Normocephalic and atraumatic.  Eyes:     Conjunctiva/sclera: Conjunctivae normal.  Neck:     Thyroid: No thyroid mass, thyromegaly or thyroid tenderness.  Cardiovascular:     Rate and Rhythm: Normal rate and regular rhythm.  Pulmonary:     Effort: Pulmonary effort is normal.     Breath sounds: Normal breath sounds.  Chest:     Breasts:        Right: Normal. No mass, nipple discharge, skin change or tenderness.        Left: Normal. No mass, nipple discharge, skin change or tenderness.  Abdominal:     Palpations: Abdomen is soft. There is no mass.     Tenderness: There  is no abdominal tenderness. There is no guarding or rebound.  Musculoskeletal:     Cervical back: Neck supple. No tenderness.  Lymphadenopathy:     Cervical: No cervical adenopathy.     Upper Body:     Right upper body: No supraclavicular, axillary or pectoral adenopathy.     Left upper body: No supraclavicular, axillary or pectoral adenopathy.  Skin:    General: Skin is warm and dry.  Neurological:     Mental Status: She is alert and oriented to person, place, and time.  Psychiatric:        Mood and Affect: Mood normal.        Behavior: Behavior normal.        Thought Content: Thought content normal.        Judgment: Judgment normal.       Assessment and Plan:   Robin Barr is a 39 y.o. female G5P1005 presenting to the [facility] Department for an yearly well woman exam/family planning visit  Contraception counseling: Reviewed all forms of birth control options in the tiered based approach. available including abstinence; over the counter/barrier methods; hormonal contraceptive medication including pill, patch, ring, injection,contraceptive implant, ECP; hormonal and nonhormonal IUDs; permanent sterilization options including vasectomy and the various tubal sterilization modalities. Risks, benefits, and typical effectiveness rates were reviewed.  Questions were answered.  Written information was also given to the patient to review.  Patient desires Nexplanon removal and to use condoms, this was prescribed for patient. She will follow up in  2-3 weeks for pap, 1 yr for PE and prn for surveillance.  She was told to call with any further questions, or with any concerns about this method of contraception.  Emphasized use of condoms 100% of the time for STI prevention.  Patient was not a candidate for ECP.   1. Encounter for counseling regarding contraception Reviewed with patient BCMs and that patient can RTC for other hormonal BCM if she changes her mind. Enc condoms with all sex for STD and pregnancy prevention. Counseled that she can use OTC spermicides with condoms for better effectiveness.   2. Nexplanon removal Nexplanon Removal Patient identified, informed consent performed, consent signed.   Appropriate time out taken. Nexplanon site identified.  Area prepped in usual sterile fashon. 2 ml of 1% lidocaine with Epinephrine was used to anesthetize the area at the distal end of the implant and along implant site. A small stab incision was made right beside the implant on the distal portion.  The Nexplanon rod was grasped manually and removed without difficulty.  There was minimal blood loss. There were no complications.  Steri-strips  were applied over the small incision.  A pressure bandage was applied to reduce any bruising.  The patient tolerated the procedure well and was given post procedure instructions.   Nexplanon:   Counseled patient to take OTC analgesic starting as soon as lidocaine starts to wear off and take regularly for at least 48 hr to decrease discomfort.  Specifically to take with food or milk to decrease stomach upset and for IB 600 mg (3 tablets) every 6 hrs; IB 800 mg (4 tablets) every 8 hrs; or Aleve 2 tablets every 12 hrs.  3. Well woman exam (no gynecological exam) Reviewed with patient healthy habits for general health. Enc MVI 1 po daily. Enc to establish with/follow up with PCP for primary care concerns and illness.   Return in about 20 days (around 07/27/2020) for Pap, annual and PRN.  Future Appointments  Date Time Provider Department Center  07/27/2020 11:00 AM [facility] PROVIDER [facility] None    Matt Holmes, PA

## 2020-07-27 ENCOUNTER — Ambulatory Visit: Payer: Self-pay

## 2020-07-31 ENCOUNTER — Ambulatory Visit: Payer: Self-pay

## 2021-04-18 ENCOUNTER — Emergency Department: Payer: Self-pay

## 2021-04-18 ENCOUNTER — Other Ambulatory Visit: Payer: Self-pay

## 2021-04-18 DIAGNOSIS — Z87891 Personal history of nicotine dependence: Secondary | ICD-10-CM | POA: Insufficient documentation

## 2021-04-18 DIAGNOSIS — T675XXA Heat exhaustion, unspecified, initial encounter: Secondary | ICD-10-CM | POA: Insufficient documentation

## 2021-04-18 LAB — BASIC METABOLIC PANEL
Anion gap: 9 (ref 5–15)
BUN: 9 mg/dL (ref 6–20)
CO2: 21 mmol/L — ABNORMAL LOW (ref 22–32)
Calcium: 9 mg/dL (ref 8.9–10.3)
Chloride: 106 mmol/L (ref 98–111)
Creatinine, Ser: 0.59 mg/dL (ref 0.44–1.00)
GFR, Estimated: >60 mL/min (ref >60)
Glucose, Bld: 127 mg/dL — ABNORMAL HIGH (ref 70–99)
Potassium: 4 mmol/L (ref 3.5–5.1)
Sodium: 136 mmol/L (ref 135–145)

## 2021-04-18 LAB — POC URINE PREG, ED: Preg Test, Ur: NEGATIVE

## 2021-04-18 LAB — CBC
HCT: 37.6 % (ref 36.0–46.0)
Hemoglobin: 12.6 g/dL (ref 12.0–15.0)
MCH: 29 pg (ref 26.0–34.0)
MCHC: 33.5 g/dL (ref 30.0–36.0)
MCV: 86.4 fL (ref 80.0–100.0)
Platelets: 205 10*3/uL (ref 150–400)
RBC: 4.35 MIL/uL (ref 3.87–5.11)
RDW: 12.9 % (ref 11.5–15.5)
WBC: 9.6 10*3/uL (ref 4.0–10.5)
nRBC: 0 % (ref 0.0–0.2)

## 2021-04-18 LAB — TROPONIN I (HIGH SENSITIVITY): Troponin I (High Sensitivity): <2 ng/L (ref <18)

## 2021-04-18 NOTE — ED Triage Notes (Signed)
Information obtained with assist of omar # [id number] spanish interpreter with [employer]. Pt states had left arm pain and chest pain. Pt states began tonight. Pt states has been nauseated, dizzy and intermittent shob.

## 2021-04-18 NOTE — ED Triage Notes (Signed)
FIRST NURSE NOTE:  Pt arrived via [facility] with reports of being outside x 3 hours pt has not drank much today c/o L arm tingling radiating to the chest c/o chest pressure 7/10, was given 1 NTG with EMS down to 5/10.  Possible pregnancy, EMS did not give ASA   Pt is spanish speaking.   Pt also given 300cc NS with EMS.

## 2021-04-19 ENCOUNTER — Emergency Department
Admission: EM | Admit: 2021-04-19 | Discharge: 2021-04-19 | Disposition: A | Discharge status: Home / Self Care | Payer: Self-pay | Attending: Emergency Medicine | Admitting: Emergency Medicine

## 2021-04-19 DIAGNOSIS — T675XXA Heat exhaustion, unspecified, initial encounter: Secondary | ICD-10-CM

## 2021-04-19 LAB — TROPONIN I (HIGH SENSITIVITY): Troponin I (High Sensitivity): <2 ng/L (ref <18)

## 2021-04-19 MED ORDER — ONDANSETRON HCL 4 MG/2ML IJ SOLN
4.0000 mg | Freq: Once | INTRAMUSCULAR | Status: AC
  Administered 2021-04-19: 4 mg via INTRAVENOUS
  Filled 2021-04-19: qty 2

## 2021-04-19 MED ORDER — KETOROLAC TROMETHAMINE 30 MG/ML IJ SOLN
15.0000 mg | Freq: Once | INTRAMUSCULAR | Status: AC
  Administered 2021-04-19: 15 mg via INTRAVENOUS
  Filled 2021-04-19: qty 1

## 2021-04-19 MED ORDER — LACTATED RINGERS IV BOLUS
1000.0000 mL | Freq: Once | INTRAVENOUS | Status: AC
  Administered 2021-04-19: 1000 mL via INTRAVENOUS

## 2021-04-19 NOTE — ED Notes (Signed)
ED Provider at bedside. 

## 2021-04-19 NOTE — ED Provider Notes (Signed)
[facility] Emergency Department Provider Note  ____________________________________________  Time seen: Approximately 4:06 AM  I have reviewed the triage vital signs and the nursing notes.   HISTORY  Chief Complaint Chest Pain   HPI Robin Barr is a 40 y.o. female with no significant past medical history other than GERD who presents for evaluation of concerns of dehydration.  Patient reports [place]  She was not really drinking much.  When she went home she started feeling lightheaded like she was going to pass out.  She also had nausea and a headache.  She then started feeling pressure in the center of her chest which she describes as severe pressure radiating down the left arm.  She reports that the chest pressure has now resolved but her left arm still feels sore.  She denies any trauma or doing any exercise involving that arm recently.  The pain in the arm is worse with movement of the arm.  The pain goes away if she is not moving her arm.  She denies any personal history of heart disease, she is not a smoker, no family history or personal history of PE or DVT, no recent travel immobilization, no leg pain or swelling, no hemoptysis or exogenous hormones.  Patient feels very dehydrated. Past Medical History:  Diagnosis Date   Medical history non-contributory     Patient Active Problem List   Diagnosis Date Noted   Gastroesophageal reflux disease without esophagitis 08/12/2019   Pregnancy 05/01/2017   Fibrocystic disease of breast 11/24/2016   Precipitous delivery 05/22/2015   Condyloma 06/30/2014    Past Surgical History:  Procedure Laterality Date   NO PAST SURGERIES      Prior to Admission medications   Medication Sig Start Date End Date Taking? Authorizing Provider  acetaminophen (TYLENOL) 500 MG tablet Take 1 tablet (500 mg total) by mouth every 6 (six) hours as  needed. Patient not taking: Reported on 07/07/2020 05/06/19   Enid Derry, PA-C  cyclobenzaprine (FLEXERIL) 5 MG tablet Take 1-2 tablets 3 times daily as needed Patient not taking: Reported on 07/07/2020 05/06/19   Enid Derry, PA-C  etonogestrel (NEXPLANON) 68 MG IMPL implant 1 each by Subdermal route once. 06/12/17   Tessa Lerner, NP    Allergies Patient has no known allergies.  Family History  Problem Relation Age of Onset   Hypertension Mother    Cancer Mother    Thyroid disease Sister    Heart disease Paternal Grandfather     Social History Social History   Tobacco Use   Smoking status: Former    Pack years: 0.00   Smokeless tobacco: Never  Substance Use Topics   Alcohol use: No    Alcohol/week: 0.0 standard drinks   Drug use: No    Review of Systems  Constitutional: Negative for fever. + Lightheadedness Eyes: Negative for visual changes. ENT: Negative for sore throat. Neck: No neck pain  Cardiovascular: Negative for chest pain. Respiratory: Negative for shortness of breath. Gastrointestinal: Negative for abdominal pain, vomiting or diarrhea. + Nausea Genitourinary: Negative for dysuria. Musculoskeletal: Negative for back pain. + L arm soreness Skin: Negative for rash. Neurological: Negative for weakness or numbness. + HA Psych: No SI or HI  ____________________________________________   PHYSICAL EXAM:  VITAL SIGNS: ED Triage Vitals  Enc Vitals Group     BP 04/18/21 2220 130/85     Pulse Rate 04/18/21 2220  97     Resp 04/18/21 2220 16     Temp 04/18/21 2225 98.8 F (37.1 C)     Temp Source 04/18/21 2220 Oral     SpO2 04/18/21 2220 100 %     Weight 04/18/21 2220 157 lb (71.2 kg)     Height --      Head Circumference --      Peak Flow --      Pain Score 04/18/21 2220 5     Pain Loc --      Pain Edu? --      Excl. in GC? --     Constitutional: Alert and oriented. Well appearing and in no apparent distress. HEENT:      Head:  Normocephalic and atraumatic.         Eyes: Conjunctivae are normal. Sclera is non-icteric.       Mouth/Throat: Mucous membranes are moist.       Neck: Supple with no signs of meningismus. Cardiovascular: Regular rate and rhythm. No murmurs, gallops, or rubs. 2+ symmetrical distal pulses are present in all extremities. No JVD. Respiratory: Normal respiratory effort. Lungs are clear to auscultation bilaterally.  Gastrointestinal: Soft, non tender, and non distended with positive bowel sounds. No rebound or guarding. Genitourinary: No CVA tenderness. Musculoskeletal:  No edema, cyanosis, or erythema of extremities. Neurologic: Normal speech and language. Face is symmetric. Moving all extremities. No gross focal neurologic deficits are appreciated. Skin: Skin is warm, dry and intact. No rash noted. Psychiatric: Mood and affect are normal. Speech and behavior are normal.  ____________________________________________   LABS (all labs ordered are listed, but only abnormal results are displayed)  Labs Reviewed  BASIC METABOLIC PANEL - Abnormal; Notable for the following components:      Result Value   CO2 21 (*)    Glucose, Bld 127 (*)    All other components within normal limits  CBC  POC URINE PREG, ED  TROPONIN I (HIGH SENSITIVITY)  TROPONIN I (HIGH SENSITIVITY)   ____________________________________________  EKG  ED ECG REPORT I, Nita Sickle, the attending physician, personally viewed and interpreted this ECG.  Normal sinus rhythm, rate of 96, normal intervals, normal axis, no ST elevations or depressions.  Normal EKG. ____________________________________________  RADIOLOGY  I have personally reviewed the images performed during this visit and I agree with the Radiologist's read.   Interpretation by Radiologist:  DG Chest 2 View  Result Date: 04/18/2021 CLINICAL DATA:  Chest pain.  Left arm pain. EXAM: CHEST - 2 VIEW COMPARISON:  10/13/2018 radiographs and CT  FINDINGS: Lung volumes are low.The cardiomediastinal contours are normal. Minor right lung base atelectasis. Pulmonary vasculature is normal. No consolidation, pleural effusion, or pneumothorax. No acute osseous abnormalities are seen. IMPRESSION: Low lung volumes with minor right lung base atelectasis. Electronically Signed   By: Narda Rutherford M.D.   On: 04/18/2021 22:53     ____________________________________________   PROCEDURES  Procedure(s) performed:yes .1-3 Lead EKG Interpretation  Date/Time: 04/19/2021 4:18 AM Performed by: Nita Sickle, MD Authorized by: Nita Sickle, MD     Interpretation: normal     ECG rate assessment: normal     Rhythm: sinus rhythm     Ectopy: none     Conduction: normal    Critical Care performed:  None ____________________________________________   INITIAL IMPRESSION / ASSESSMENT AND PLAN / ED COURSE  40 y.o. female with no significant past medical history other than GERD who presents for evaluation of concerns of dehydration.  Patient  with headache, lightheadedness, nausea, chest pain, and left arm soreness [place]  She is well-appearing in no distress with normal vital signs.  Patient complaining of soreness of her left arm with movement.  Exam of the arm is normal neurovascularly intact with full painless range of motion of all joints and no signs of trauma.  EKG with no signs of ischemia or dysrhythmias.  Presentation is most likely concern for heat exhaustion considering history and symptoms.  2 high-sensitivity troponins were negative with no signs of cardiac ischemia.  Labs showing no significant electrolyte derangements or severe dehydration, no leukocytosis or anemia.  Chest x-ray with no acute findings, visualized by me and confirmed by radiology.  Sugar has been slightly elevated since 2019 which patient was informed about that.  After receiving IV Toradol, IV fluids and Zofran  patient feels markedly improved and no longer has any complaints.  Will discharge home with increase oral hydration, avoiding heat for the next 24 to 48 hours and follow-up with PCP.  Discussed my standard return precautions.     _____________________________________________ Please note:  Patient was evaluated in Emergency Department today for the symptoms described in the history of present illness. Patient was evaluated in the context of the global COVID-19 pandemic, which necessitated consideration that the patient might be at risk for infection with the SARS-CoV-2 virus that causes COVID-19. Institutional protocols and algorithms that pertain to the evaluation of patients at risk for COVID-19 are in a state of rapid change based on information released by regulatory bodies including the CDC and federal and state organizations. These policies and algorithms were followed during the patient's care in the ED.  Some ED evaluations and interventions may be delayed as a result of limited staffing during the pandemic.   [place] Controlled Substance Database was reviewed by me. ____________________________________________   FINAL CLINICAL IMPRESSION(S) / ED DIAGNOSES   Final diagnoses:  Heat exhaustion, initial encounter      NEW MEDICATIONS STARTED DURING THIS VISIT:  ED Discharge Orders     None        Note:  This document was prepared using Dragon voice recognition software and may include unintentional dictation errors.    Don Perking, [place], MD 04/19/21 [phone]

## 2021-04-19 NOTE — ED Notes (Signed)
Nursing assessment with aid of iterpreter # [phone]

## 2021-09-05 ENCOUNTER — Emergency Department: Payer: Self-pay

## 2021-09-05 ENCOUNTER — Other Ambulatory Visit: Payer: Self-pay

## 2021-09-05 ENCOUNTER — Emergency Department
Admission: EM | Admit: 2021-09-05 | Discharge: 2021-09-05 | Disposition: A | Discharge status: Home / Self Care | Payer: Self-pay | Attending: Student in an Organized Health Care Education/Training Program | Admitting: Student in an Organized Health Care Education/Training Program

## 2021-09-05 DIAGNOSIS — Z87891 Personal history of nicotine dependence: Secondary | ICD-10-CM | POA: Insufficient documentation

## 2021-09-05 DIAGNOSIS — N939 Abnormal uterine and vaginal bleeding, unspecified: Secondary | ICD-10-CM

## 2021-09-05 DIAGNOSIS — N938 Other specified abnormal uterine and vaginal bleeding: Secondary | ICD-10-CM | POA: Insufficient documentation

## 2021-09-05 DIAGNOSIS — N9489 Other specified conditions associated with female genital organs and menstrual cycle: Secondary | ICD-10-CM | POA: Insufficient documentation

## 2021-09-05 DIAGNOSIS — R1030 Lower abdominal pain, unspecified: Secondary | ICD-10-CM | POA: Insufficient documentation

## 2021-09-05 DIAGNOSIS — Z3A12 12 weeks gestation of pregnancy: Secondary | ICD-10-CM | POA: Insufficient documentation

## 2021-09-05 DIAGNOSIS — O209 Hemorrhage in early pregnancy, unspecified: Secondary | ICD-10-CM | POA: Insufficient documentation

## 2021-09-05 DIAGNOSIS — O26891 Other specified pregnancy related conditions, first trimester: Secondary | ICD-10-CM | POA: Insufficient documentation

## 2021-09-05 DIAGNOSIS — O4690 Antepartum hemorrhage, unspecified, unspecified trimester: Secondary | ICD-10-CM

## 2021-09-05 LAB — CBC
HCT: 34 % — ABNORMAL LOW (ref 36.0–46.0)
Hemoglobin: 11.4 g/dL — ABNORMAL LOW (ref 12.0–15.0)
MCH: 29.7 pg (ref 26.0–34.0)
MCHC: 33.5 g/dL (ref 30.0–36.0)
MCV: 88.5 fL (ref 80.0–100.0)
Platelets: 175 10*3/uL (ref 150–400)
RBC: 3.84 MIL/uL — ABNORMAL LOW (ref 3.87–5.11)
RDW: 12.5 % (ref 11.5–15.5)
WBC: 5.7 10*3/uL (ref 4.0–10.5)
nRBC: 0 % (ref 0.0–0.2)

## 2021-09-05 LAB — COMPREHENSIVE METABOLIC PANEL
ALT: 7 U/L (ref 0–44)
AST: 13 U/L — ABNORMAL LOW (ref 15–41)
Albumin: 3.5 g/dL (ref 3.5–5.0)
Alkaline Phosphatase: 40 U/L (ref 38–126)
Anion gap: 5 (ref 5–15)
BUN: 8 mg/dL (ref 6–20)
CO2: 22 mmol/L (ref 22–32)
Calcium: 8.8 mg/dL — ABNORMAL LOW (ref 8.9–10.3)
Chloride: 106 mmol/L (ref 98–111)
Creatinine, Ser: 0.42 mg/dL — ABNORMAL LOW (ref 0.44–1.00)
GFR, Estimated: >60 mL/min (ref >60)
Glucose, Bld: 171 mg/dL — ABNORMAL HIGH (ref 70–99)
Potassium: 3.7 mmol/L (ref 3.5–5.1)
Sodium: 133 mmol/L — ABNORMAL LOW (ref 135–145)
Total Bilirubin: 0.5 mg/dL (ref 0.3–1.2)
Total Protein: 6.6 g/dL (ref 6.5–8.1)

## 2021-09-05 LAB — HCG, QUANTITATIVE, PREGNANCY: hCG, Beta Chain, Quant, S: 103703 m[IU]/mL — ABNORMAL HIGH (ref <5)

## 2021-09-05 LAB — ABO/RH: ABO/RH(D): A POS

## 2021-09-05 NOTE — Discharge Instructions (Signed)
Follow up with your regular doctor if not improving in 3 days, return if worsening 

## 2021-09-05 NOTE — ED Notes (Signed)
Pt dc ppw provided with [employer]. Pt given information about followup and when to return to the ED with increasing symptoms. Pt denies any questions at this time. Pt provides verbal consent for dc. Pt assisted off unit on foot with family

## 2021-09-05 NOTE — ED Provider Notes (Signed)
Emergency Medicine Provider Triage Evaluation Note  Lakelynn Severtson , a 40 y.o. female  was evaluated in triage.  Pt complains of vaginal bleeding pregnancy.  Review of Systems  Positive: Vaginal bleeding pregnancy, lower abdominal pain Negative: Fever, chills, chest pain or shortness of breath  Physical Exam  Ht 5\' 1"  (1.549 m)   Wt 66.2 kg   LMP 06/20/2021 (Exact Date)   BMI 27.59 kg/m  Gen:   Awake, no distress   Resp:  Normal effort  MSK:   Moves extremities without difficulty  Other:    Medical Decision Making  Medically screening exam initiated at 2:15 PM.  Appropriate orders placed.  Shantay Sonn was informed that the remainder of the evaluation will be completed by another provider, this initial triage assessment does not replace that evaluation, and the importance of remaining in the ED until their evaluation is complete.     Robley Fries, PA-C 09/05/21 1416    09/07/21, MD 09/05/21 [phone]

## 2021-09-05 NOTE — ED Provider Notes (Signed)
[facility] Emergency Department Provider Note  ____________________________________________   Event Date/Time   First MD Initiated Contact with Patient 09/05/21 1612     (approximate)  I have reviewed the triage vital signs and the nursing notes.   HISTORY  Chief Complaint Vaginal Bleeding    HPI Robin Barr is a 40 y.o. female presents emergency department complaining of vaginal bleeding in pregnancy.  Some lower abdominal pain.  Patient states bleeding started yesterday.  Is concerned as she thinks she is about 3 months pregnant.  Has had no prenatal care at this time.  Past Medical History:  Diagnosis Date   Medical history non-contributory     Patient Active Problem List   Diagnosis Date Noted   Gastroesophageal reflux disease without esophagitis 08/12/2019   Pregnancy 05/01/2017   Fibrocystic disease of breast 11/24/2016   Precipitous delivery 05/22/2015   Condyloma 06/30/2014    Past Surgical History:  Procedure Laterality Date   NO PAST SURGERIES      Prior to Admission medications   Medication Sig Start Date End Date Taking? Authorizing Provider  acetaminophen (TYLENOL) 500 MG tablet Take 1 tablet (500 mg total) by mouth every 6 (six) hours as needed. Patient not taking: Reported on 07/07/2020 05/06/19   Enid Derry, PA-C  cyclobenzaprine (FLEXERIL) 5 MG tablet Take 1-2 tablets 3 times daily as needed Patient not taking: Reported on 07/07/2020 05/06/19   Enid Derry, PA-C  etonogestrel (NEXPLANON) 68 MG IMPL implant 1 each by Subdermal route once. 06/12/17   Tessa Lerner, NP    Allergies Patient has no known allergies.  Family History  Problem Relation Age of Onset   Hypertension Mother    Cancer Mother    Thyroid disease Sister    Heart disease Paternal Grandfather     Social History Social History   Tobacco Use   Smoking status: Former   Smokeless tobacco: Never  Substance Use Topics   Alcohol  use: No    Alcohol/week: 0.0 standard drinks   Drug use: No    Review of Systems  Constitutional: No fever/chills Eyes: No visual changes. ENT: No sore throat. Respiratory: Denies cough Cardiovascular: Denies chest pain Gastrointestinal: Denies abdominal pain Genitourinary: Negative for dysuria. Musculoskeletal: Negative for back pain. Skin: Negative for rash. Psychiatric: no mood changes,     ____________________________________________   PHYSICAL EXAM:  VITAL SIGNS: ED Triage Vitals  Enc Vitals Group     BP 09/05/21 1412 119/80     Pulse Rate 09/05/21 1412 86     Resp 09/05/21 1412 20     Temp 09/05/21 1412 98.3 F (36.8 C)     Temp Source 09/05/21 1412 Oral     SpO2 09/05/21 1412 99 %     Weight 09/05/21 1410 146 lb (66.2 kg)     Height 09/05/21 1410 5\' 1"  (1.549 m)     Head Circumference --      Peak Flow --      Pain Score 09/05/21 1410 5     Pain Loc --      Pain Edu? --      Excl. in GC? --     Constitutional: Alert and oriented. Well appearing and in no acute distress. Eyes: Conjunctivae are normal.  Head: Atraumatic. Nose: No congestion/rhinnorhea. Mouth/Throat: Mucous membranes are moist.   Neck:  supple no lymphadenopathy noted Cardiovascular: Normal rate, regular rhythm.  Respiratory: Normal respiratory effort.  No retractions,  Abd: soft nontender bs normal  all 4 quad GU: deferred Musculoskeletal: FROM all extremities, warm and well perfused Neurologic:  Normal speech and language.  Skin:  Skin is warm, dry and intact. No rash noted. Psychiatric: Mood and affect are normal. Speech and behavior are normal.  ____________________________________________   LABS (all labs ordered are listed, but only abnormal results are displayed)  Labs Reviewed  HCG, QUANTITATIVE, PREGNANCY - Abnormal; Notable for the following components:      Result Value   hCG, Beta Chain, Quant, S 103,703 (*)    All other components within normal limits  CBC -  Abnormal; Notable for the following components:   RBC 3.84 (*)    Hemoglobin 11.4 (*)    HCT 34.0 (*)    All other components within normal limits  COMPREHENSIVE METABOLIC PANEL - Abnormal; Notable for the following components:   Sodium 133 (*)    Glucose, Bld 171 (*)    Creatinine, Ser 0.42 (*)    Calcium 8.8 (*)    AST 13 (*)    All other components within normal limits  ABO/RH   ____________________________________________   ____________________________________________  RADIOLOGY  Ultrasound OB less than 14-week  ____________________________________________   PROCEDURES  Procedure(s) performed: No  Procedures    ____________________________________________   INITIAL IMPRESSION / ASSESSMENT AND PLAN / ED COURSE  Pertinent labs & imaging results that were available during my care of the patient were reviewed by me and considered in my medical decision making (see chart for details).   The patient is a 40 year old female presents to the emergency department with vaginal bleeding pregnancy.  See HPI.  Physical exam is unremarkable and patient is stable.  DDx: Subchorionic hemorrhage, threatened miscarriage, miscarriage, ectopic  Labs are reassuring, beta-hCG is 103,703, CBC is normal, comprehensive metabolic panel is normal ABO/Rh is pending  Ultrasound OB less than 14 weeks to assess fetal vitality and placement  Ultrasound OB less than 14 weeks shows a IUP at 10 weeks 6 days, no subchorionic hemorrhage.  This was read by radiology but reviewed by me  I did discuss the findings with the patient via the [facility] interpreter.  She states she has not had any bleeding since yesterday.  I recommend pelvic rest.  She should follow-up with her OB/GYN.  She states she has an appointment at 1 December.  If she is worsening she should return to the emergency department.  ABO/Rh is a positive so no concerns for RhoGAM.  She was discharged in stable condition.  Robin Barr was evaluated in Emergency Department on 09/05/2021 for the symptoms described in the history of present illness. She was evaluated in the context of the global COVID-19 pandemic, which necessitated consideration that the patient might be at risk for infection with the SARS-CoV-2 virus that causes COVID-19. Institutional protocols and algorithms that pertain to the evaluation of patients at risk for COVID-19 are in a state of rapid change based on information released by regulatory bodies including the CDC and federal and state organizations. These policies and algorithms were followed during the patient's care in the ED.    As part of my medical decision making, I reviewed the following data within the electronic MEDICAL RECORD NUMBER Nursing notes reviewed and incorporated, Interpreter needed, Labs reviewed , Old chart reviewed, Radiograph reviewed , Notes from prior ED visits, and [place] Controlled Substance Database  ____________________________________________   FINAL CLINICAL IMPRESSION(S) / ED DIAGNOSES  Final diagnoses:  Vaginal bleeding  Vaginal bleeding in pregnancy  NEW MEDICATIONS STARTED DURING THIS VISIT:  Discharge Medication List as of 09/05/2021  5:17 PM       Note:  This document was prepared using Dragon voice recognition software and may include unintentional dictation errors.    Faythe Ghee, PA-C 09/05/21 1845    Willy Eddy, MD 09/05/21 [phone]

## 2021-09-05 NOTE — ED Notes (Signed)
Patient transported to Ultrasound 

## 2021-09-05 NOTE — ED Triage Notes (Signed)
Pt reports is 3 months pregnant and started with abd pain and vaginal bleeding last pm.

## 2021-09-29 LAB — OB RESULTS CONSOLE RUBELLA ANTIBODY, IGM: Rubella: IMMUNE

## 2021-09-29 LAB — OB RESULTS CONSOLE VARICELLA ZOSTER ANTIBODY, IGG: Varicella: NON-IMMUNE/NOT IMMUNE

## 2021-09-29 LAB — OB RESULTS CONSOLE HEPATITIS B SURFACE ANTIGEN: Hepatitis B Surface Ag: NEGATIVE

## 2021-10-05 ENCOUNTER — Other Ambulatory Visit: Payer: Self-pay | Admitting: Physician Assistant

## 2021-10-05 DIAGNOSIS — Z3689 Encounter for other specified antenatal screening: Secondary | ICD-10-CM

## 2021-10-17 NOTE — L&D Delivery Note (Signed)
Delivery Note ? ?Vidhi Delellis is a [id number] at [redacted]w[redacted]d with an LMP of 06/20/21, consistent with [place] at [redacted]w[redacted]d.  ? ?First Stage: ?Labor onset: 1100 ?Augmentation: oxytocin ?Analgesia /Anesthesia intrapartum: epidural ?SROM at 0630 ?GBS: negative ?IP Antibiotics: none ? ?Second Stage: ?Complete dilation at 1505 ?Onset of pushing at 1530 ?FHR second stage category 2. 155 bpm with moderate, intermittent small variable decels with pushing  ? ?Maude presented to L&D with PPROM. She was 2/thick/-3. She progressed  to C/C/+2 with a spontaneous urge to push.  She pushed effectively over approximately 20 seconds for a spontaneous vaginal birth. Delivery of a viable baby girl on 02/28/2022 at 1530 by Chari Manning CNM.Delivery of fetal head in OA position with restitution to ROT. no nuchal cord;  Anterior then posterior shoulders delivered easily with gentle downward traction. Baby placed on mom's chest, and attended to by baby RN. Cord double clamped after cessation of pulsation, cut by Sister of baby ? ?Cord blood sample collection: No A POS ?Collection of cord blood donation N/A ?Arterial cord blood sample N/A ? ?Third Stage: ?Oxytocin bolus started after delivery of infant for hemorrhage prophylaxis  ?Placenta delivered [facility] intact with 3 VC @ 1535 ?Placenta disposition: discarded per protocol ?Uterine tone firm / bleeding scant ? ?no laceration identified  ?Anesthesia for repair: N/A ?Repair N/A ?Est. Blood Loss (mL): 50 ? ?Complications: none ? ?Mom to postpartum.  Baby to Couplet care / Skin to Skin. ? ?Newborn: ?Information for the patient's newborn:  Lexani Corona, Girl Sheina [[id number]]  ?Live born female  ?Birth Weight:   ?APGAR: 9, 9 ? ?Newborn Delivery   ?Birth date/time: 02/[birth year]20[birth year] 15:30:00 ?Delivery type: Vaginal, Spontaneous ?  ?  ?  ? ?Feeding planned: Breast/Formula ? ?---------- ?Chari Manning CNM ?Certified Nurse Midwife ?[facility] ?[facility]   ?

## 2021-11-04 ENCOUNTER — Other Ambulatory Visit: Payer: Self-pay

## 2021-11-04 DIAGNOSIS — K219 Gastro-esophageal reflux disease without esophagitis: Secondary | ICD-10-CM

## 2021-11-04 DIAGNOSIS — O09522 Supervision of elderly multigravida, second trimester: Secondary | ICD-10-CM

## 2021-11-09 ENCOUNTER — Other Ambulatory Visit: Payer: Self-pay | Admitting: Physician Assistant

## 2021-11-09 ENCOUNTER — Other Ambulatory Visit: Payer: Self-pay

## 2021-11-09 ENCOUNTER — Ambulatory Visit: Payer: Self-pay

## 2021-11-09 ENCOUNTER — Ambulatory Visit: Payer: Self-pay | Attending: Maternal & Fetal Medicine

## 2021-11-09 ENCOUNTER — Ambulatory Visit (HOSPITAL_BASED_OUTPATIENT_CLINIC_OR_DEPARTMENT_OTHER): Payer: Self-pay

## 2021-11-09 DIAGNOSIS — Z3A20 20 weeks gestation of pregnancy: Secondary | ICD-10-CM | POA: Insufficient documentation

## 2021-11-09 DIAGNOSIS — O09522 Supervision of elderly multigravida, second trimester: Secondary | ICD-10-CM

## 2021-11-09 DIAGNOSIS — Z3689 Encounter for other specified antenatal screening: Secondary | ICD-10-CM

## 2021-11-09 DIAGNOSIS — Z363 Encounter for antenatal screening for malformations: Secondary | ICD-10-CM | POA: Insufficient documentation

## 2021-11-09 DIAGNOSIS — O0942 Supervision of pregnancy with grand multiparity, second trimester: Secondary | ICD-10-CM | POA: Insufficient documentation

## 2021-11-09 NOTE — Progress Notes (Signed)
Referring Provider:  [facility] Length of Consultation: 30 minutes  [name] was referred to [facility] at [facility] for genetic counseling because of advanced maternal age.  The patient will be 41 years old at the time of delivery.  This note summarizes the information we discussed at this visit with the patient and her partner, Robin Barr.  The couple was seen with the aid of a Spanish interpreter.    We explained that the chance of a chromosome abnormality increases with maternal age.  Chromosomes and examples of chromosome problems were reviewed.  Humans typically have 46 chromosomes in each cell, with half passed through each sperm and egg.  Any change in the number or structure of chromosomes can increase the risk of problems in the physical and mental development of a pregnancy.   Based upon age of the patient and the current gestational age, the chance of any chromosome abnormality was 1 in 74. The chance of Down syndrome, the most common chromosome problem associated with maternal age, was 1 in 29.  The risk of chromosome problems is in addition to the 3% general population risk for birth defects and mental retardation.  The greatest chance, of course, is that the baby would be born in good health.  We discussed the following prenatal screening and testing options for this pregnancy:  Cell free fetal DNA testing from maternal blood may be used to determine whether or not the baby may have Down syndrome, trisomy 21, or trisomy 38.  This test utilizes a maternal blood sample and DNA sequencing technology to isolate circulating cell free fetal DNA from maternal plasma.  The fetal DNA can then be analyzed for DNA sequences that are derived from the three most common chromosomes involved in aneuploidy, chromosomes 13, 18, and 21.  If the overall amount of DNA is greater than the expected level for any of these chromosomes, aneuploidy is suspected.  While we do not  consider it a replacement for invasive testing and karyotype analysis, a negative result from this testing would be reassuring, though not a guarantee of a normal chromosome complement for the baby.  An abnormal result is certainly suggestive of an abnormal chromosome complement, though we would still recommend amniocentesis to confirm any findings from this testing.  Maternal serum marker screening, a blood test that measures pregnancy proteins, can provide risk assessments for Down syndrome, trisomy 18, and open neural tube defects (spina bifida, anencephaly). Because it does not directly examine the fetus, it cannot positively diagnose or rule out these problems. If prior chromosome screening has been done, an AFP only should be offered to assess for open neural tube defects.  Targeted ultrasound uses high frequency sound waves to create an image of the developing fetus.  An ultrasound is often recommended as a routine means of evaluating the pregnancy.  It is also used to screen for fetal anatomy problems (for example, a heart defect) that might be suggestive of a chromosomal or other abnormality.   Amniocentesis involves the removal of a small amount of amniotic fluid from the sac surrounding the fetus with the use of a thin needle inserted through the maternal abdomen and uterus.  Ultrasound guidance is used throughout the procedure.  Fetal cells from amniotic fluid are directly evaluated and > 99.5% of chromosome problems and > 98% of open neural tube defects can be detected. This procedure is generally performed after the 15th week of pregnancy.  The main risks to  this procedure include complications leading to miscarriage in less than 1 in 200 cases (0.5%).  Cystic Fibrosis and Spinal Muscular Atrophy (SMA) screening were also discussed with the patient. Both conditions are recessive, which means that both parents must be carriers in order to have a child with the disease.  Cystic fibrosis (CF) is  one of the most common genetic conditions in persons of Caucasian ancestry.  This condition occurs in approximately 1 in 2,500 Caucasian persons and results in thickened secretions in the lungs, digestive, and reproductive systems.  For a baby to be at risk for having CF, both of the parents must be carriers for this condition.  Approximately 1 in 7 Caucasian persons is a carrier for CF.  Current carrier testing looks for the most common mutations in the gene for CF and can detect approximately 90% of carriers in the Caucasian population.  This means that the carrier screening can greatly reduce, but cannot eliminate, the chance for an individual to have a child with CF.  If an individual is found to be a carrier for CF, then carrier testing would be available for the partner. As part of [address] newborn screening profile, all babies born in the state of [place] will have a two-tier screening process.  Specimens are first tested to determine the concentration of immunoreactive trypsinogen (IRT).  The top 5% of specimens with the highest IRT values then undergo DNA testing using a panel of over 40 common CF mutations. SMA is a neurodegenerative disorder that leads to atrophy of skeletal muscle and overall weakness.  This condition is also more prevalent in the Caucasian population, with 1 in 40-1 in 60 persons being a carrier and 1 in 6,000-1 in 10,000 children being affected.  There are multiple forms of the disease, with some causing death in infancy to other forms with survival into adulthood.  The genetics of SMA is complex, but carrier screening can detect up to 95% of carriers in the Caucasian population.  Similar to CF, a negative result can greatly reduce, but cannot eliminate, the chance to have a child with SMA. Hemoglobinopathy screening was also made available to the patient.  We obtained a detailed family history and pregnancy history.  The of the family history is unremarkable for  birth defects, developmental delays, recurrent pregnancy loss or known chromosome abnormalities.  [name] stated that this is her sixth pregnancy, the third with her current partner, Robin Barr.  She has two sons and a daughter, all of whom are in good health, from a prior relationship.  She and Robin Barr have two sons, ages 43 and 4 years, who are both healthy. She reported no complications or exposures in this pregnancy to alcohol, tobacco, recreational drugs or prescription medications.  After consideration of the options, [name] elected to proceed with an ultrasound only and to decline all other screening and testing options.  An ultrasound was performed at the time of the visit.  The gestational age was consistent with 20 weeks.   No markers of aneuploidy were noted, but it is important to remember that a normal ultrasound does not exclude the possibility of birth defect or chromosome condition.  Please refer to the ultrasound report for details of that study.  [name] was encouraged to call with questions or concerns.  We can be contacted at [phone].  Plan of Care: Declined screening for aneuploidy, AFP and carrier screening.  Declined amniocentesis. Follow up ultrasound in 4-6  weeks as ordered per Dr. Grace BushyBooker.   Robin Andersoneborah F. Danyael Alipio, MS, CGC

## 2021-11-28 ENCOUNTER — Observation Stay
Admission: EM | Admit: 2021-11-28 | Discharge: 2021-11-28 | Disposition: A | Discharge status: Home / Self Care | Payer: Self-pay | Attending: Obstetrics and Gynecology | Admitting: Obstetrics and Gynecology

## 2021-11-28 ENCOUNTER — Other Ambulatory Visit: Payer: Self-pay

## 2021-11-28 ENCOUNTER — Encounter: Payer: Self-pay | Admitting: Obstetrics and Gynecology

## 2021-11-28 DIAGNOSIS — Z3A23 23 weeks gestation of pregnancy: Secondary | ICD-10-CM | POA: Insufficient documentation

## 2021-11-28 DIAGNOSIS — O4692 Antepartum hemorrhage, unspecified, second trimester: Secondary | ICD-10-CM | POA: Diagnosis present

## 2021-11-28 DIAGNOSIS — O23512 Infections of cervix in pregnancy, second trimester: Principal | ICD-10-CM | POA: Insufficient documentation

## 2021-11-28 DIAGNOSIS — O09522 Supervision of elderly multigravida, second trimester: Secondary | ICD-10-CM | POA: Insufficient documentation

## 2021-11-28 LAB — URINALYSIS, COMPLETE (UACMP) WITH MICROSCOPIC
Bilirubin Urine: NEGATIVE
Glucose, UA: NEGATIVE mg/dL
Ketones, ur: NEGATIVE mg/dL
Leukocytes,Ua: NEGATIVE
Nitrite: NEGATIVE
Protein, ur: NEGATIVE mg/dL
Specific Gravity, Urine: 1.025 (ref 1.005–1.030)
pH: 5.5 (ref 5.0–8.0)

## 2021-11-28 LAB — WET PREP, GENITAL
Clue Cells Wet Prep HPF POC: NONE SEEN
Sperm: NONE SEEN
Trich, Wet Prep: NONE SEEN
Yeast Wet Prep HPF POC: NONE SEEN

## 2021-11-28 LAB — CHLAMYDIA/NGC RT PCR (ARMC ONLY)
Chlamydia Tr: NOT DETECTED
N gonorrhoeae: NOT DETECTED

## 2021-11-28 MED ORDER — METRONIDAZOLE 500 MG PO TABS
500.0000 mg | ORAL_TABLET | Freq: Two times a day (BID) | ORAL | Status: DC
  Administered 2021-11-28: 500 mg via ORAL
  Filled 2021-11-28: qty 1

## 2021-11-28 MED ORDER — AZITHROMYCIN 500 MG PO TABS
1000.0000 mg | ORAL_TABLET | Freq: Once | ORAL | Status: AC
  Administered 2021-11-28: 1000 mg via ORAL
  Filled 2021-11-28: qty 2

## 2021-11-28 MED ORDER — METRONIDAZOLE 500 MG PO TABS: 500.0000 mg | ORAL_TABLET | Freq: Two times a day (BID) | ORAL | 0 refills | Status: AC

## 2021-11-28 NOTE — Discharge Instructions (Signed)
Pelvic rest for at least 1 week.

## 2021-11-28 NOTE — OB Triage Note (Signed)

## 2021-11-28 NOTE — Discharge Summary (Signed)
Robin Barr is a 41 y.o. female. She is at [redacted]w[redacted]d gestation. Patient's last menstrual period was 06/20/2021 (exact date). Estimated Date of Delivery: 03/27/22  [facility] care site: [facility]   Current pregnancy complicated by:  Advanced maternal Age: MFM anatomy performed, declined genetic testing.  No prenatal recs: pt reports uncomplicated pregnancy, hx 5 SVDs  Chief complaint: sees blood when she wipes after peeing since this AM. Reports active FM, denies contractions or LOF.    S: Resting comfortably. no CTX, no LOF,  Active fetal movement. Denies: HA, visual changes, SOB, or RUQ/epigastric pain  Maternal Medical History:   Past Medical History:  Diagnosis Date   Medical history non-contributory     Past Surgical History:  Procedure Laterality Date   NO PAST SURGERIES      No Known Allergies  Prior to Admission medications   Medication Sig Start Date End Date Taking? Authorizing Provider  acetaminophen (TYLENOL) 500 MG tablet Take 1 tablet (500 mg total) by mouth every 6 (six) hours as needed. Patient not taking: Reported on 07/07/2020 05/06/19   Enid Derry, PA-C  cyclobenzaprine (FLEXERIL) 5 MG tablet Take 1-2 tablets 3 times daily as needed Patient not taking: Reported on 07/07/2020 05/06/19   Enid Derry, PA-C  Prenatal Vit-Fe Fumarate-FA (PRENATAL MULTIVITAMIN) TABS tablet Take 1 tablet by mouth daily at 12 noon.    [provider]      Social History: She  reports that she has never smoked. She has never used smokeless tobacco. She reports that she does not drink alcohol and does not use drugs.  Family History: family history includes Cancer in her mother; Heart disease in her paternal grandfather; Hypertension in her mother; Thyroid disease in her sister.   Review of Systems: A full review of systems was performed and negative except as noted in the HPI.     O:  BP 119/62 (BP Location: Left Arm)    Pulse (!) 102    Resp 14    Ht 4\' 11"   (1.499 m)    Wt 70.8 kg    LMP 06/20/2021 (Exact Date)    BMI 31.51 kg/m  Results for orders placed or performed during the hospital encounter of 11/28/21 (from the past 48 hour(s))  Wet prep, genital   Collection Time: 11/28/21  9:18 PM   Specimen: Cervical/Vaginal swab; Genital  Result Value Ref Range   Yeast Wet Prep HPF POC NONE SEEN NONE SEEN   Trich, Wet Prep NONE SEEN NONE SEEN   Clue Cells Wet Prep HPF POC NONE SEEN NONE SEEN   WBC, Wet Prep HPF POC FEW (A) <10   Sperm NONE SEEN      Constitutional: NAD, AAOx3  HE/ENT: extraocular movements grossly intact, moist mucous membranes CV: RRR PULM: nl respiratory effort, CTABL     Abd: gravid, non-tender, non-distended, soft      Ext: Non-tender, Nonedematous   Psych: mood appropriate, speech normal Pelvic: SSE done: scant frothy pink tinged blood noted, cervix friable with swabs. Cervix long and closed.     Fetal  monitoring: Cat I Appropriate for GA Baseline: 150bpm Variability: moderate Accelerations: 10*10 bpm present x >2 Decelerations absent Time 01/26/22  Toco: no UCs or UI noted.   A/P: 41 y.o. [redacted]w[redacted]d here for antenatal surveillance for vaginal bleeding  Principle Diagnosis:  acute cervicitis, 23wks  Preterm labor: not present.  Fetal Wellbeing: Reassuring Cat 1 tracing, Reactive NST Treat for acute cervicitis due to friability and vaginal discharge.  UA pending due to lab equipment issue.  D/c home stable, precautions reviewed, follow-up at [facility].     Randa Ngo, CNM 11/28/2021  10:48 PM

## 2021-11-28 NOTE — OB Triage Note (Signed)
Pt Robin Barr 41 y.o. presents to labor and delivery triage reporting vaginal bleeding that started this morning. She states that she sees blood when she wipes after peeing. Denies needing to wear a menstrual pad. Pt is a G6P3003 at [redacted]w[redacted]d . Pt denies signs and symptoms consistent with rupture of membranes. Pt denies contractions and states positive fetal movement. External FM and TOCO applied to non-tender abdomen and assessing. Initial FHR 155. Vital signs obtained and within normal limits. Provider notified of pt.

## 2021-12-16 ENCOUNTER — Other Ambulatory Visit: Payer: Self-pay

## 2021-12-16 DIAGNOSIS — O0942 Supervision of pregnancy with grand multiparity, second trimester: Secondary | ICD-10-CM

## 2021-12-16 DIAGNOSIS — O09522 Supervision of elderly multigravida, second trimester: Secondary | ICD-10-CM

## 2021-12-21 ENCOUNTER — Ambulatory Visit: Payer: Self-pay | Attending: Obstetrics and Gynecology

## 2021-12-21 ENCOUNTER — Other Ambulatory Visit: Payer: Self-pay

## 2021-12-21 DIAGNOSIS — Z3A26 26 weeks gestation of pregnancy: Secondary | ICD-10-CM | POA: Insufficient documentation

## 2021-12-21 DIAGNOSIS — O99212 Obesity complicating pregnancy, second trimester: Secondary | ICD-10-CM | POA: Insufficient documentation

## 2021-12-21 DIAGNOSIS — O09522 Supervision of elderly multigravida, second trimester: Secondary | ICD-10-CM | POA: Insufficient documentation

## 2021-12-21 DIAGNOSIS — O0942 Supervision of pregnancy with grand multiparity, second trimester: Secondary | ICD-10-CM | POA: Insufficient documentation

## 2021-12-21 DIAGNOSIS — E669 Obesity, unspecified: Secondary | ICD-10-CM | POA: Insufficient documentation

## 2021-12-30 LAB — OB RESULTS CONSOLE RPR: RPR: NONREACTIVE

## 2021-12-30 LAB — OB RESULTS CONSOLE HIV ANTIBODY (ROUTINE TESTING): HIV: NONREACTIVE

## 2022-01-27 ENCOUNTER — Other Ambulatory Visit: Payer: Self-pay

## 2022-01-27 DIAGNOSIS — O09523 Supervision of elderly multigravida, third trimester: Secondary | ICD-10-CM

## 2022-01-27 DIAGNOSIS — Z8719 Personal history of other diseases of the digestive system: Secondary | ICD-10-CM

## 2022-01-27 DIAGNOSIS — Z8759 Personal history of other complications of pregnancy, childbirth and the puerperium: Secondary | ICD-10-CM

## 2022-01-27 DIAGNOSIS — O09522 Supervision of elderly multigravida, second trimester: Secondary | ICD-10-CM

## 2022-01-31 LAB — OB RESULTS CONSOLE GC/CHLAMYDIA
Chlamydia: NEGATIVE
Gonorrhea: NEGATIVE

## 2022-02-01 ENCOUNTER — Ambulatory Visit: Payer: Self-pay | Attending: Obstetrics and Gynecology

## 2022-02-01 ENCOUNTER — Ambulatory Visit (HOSPITAL_BASED_OUTPATIENT_CLINIC_OR_DEPARTMENT_OTHER): Payer: Self-pay | Admitting: Obstetrics and Gynecology

## 2022-02-01 ENCOUNTER — Other Ambulatory Visit: Payer: Self-pay

## 2022-02-01 DIAGNOSIS — O24419 Gestational diabetes mellitus in pregnancy, unspecified control: Secondary | ICD-10-CM | POA: Insufficient documentation

## 2022-02-01 DIAGNOSIS — Z3A32 32 weeks gestation of pregnancy: Secondary | ICD-10-CM

## 2022-02-01 DIAGNOSIS — O99213 Obesity complicating pregnancy, third trimester: Secondary | ICD-10-CM

## 2022-02-01 DIAGNOSIS — Z8719 Personal history of other diseases of the digestive system: Secondary | ICD-10-CM

## 2022-02-01 DIAGNOSIS — O09523 Supervision of elderly multigravida, third trimester: Secondary | ICD-10-CM

## 2022-02-01 DIAGNOSIS — Z8759 Personal history of other complications of pregnancy, childbirth and the puerperium: Secondary | ICD-10-CM

## 2022-02-01 DIAGNOSIS — E669 Obesity, unspecified: Secondary | ICD-10-CM

## 2022-02-01 DIAGNOSIS — O0943 Supervision of pregnancy with grand multiparity, third trimester: Secondary | ICD-10-CM | POA: Insufficient documentation

## 2022-02-01 NOTE — Progress Notes (Signed)
Maternal-Fetal Medicine  ? ?Name: Robin Barr ?DOB: 12/22/1980 ?MRN: [id number] ?Referring Provider: Hassan Buckler, CNM ? ?I had the pleasure of seeing Ms. Bayli Laumann today at [facility].  She is G6 P5 at 32w 2d gestation and is here for fetal growth assessment.  I counseled the patient with help of Spanish language interpreter present in the room.  She has a new diagnosis of gestational diabetes. ?Patient reports she has gestational diabetes and will start taking metformin from today. ? ?Ultrasound ?Fetal growth is appropriate for gestational age.  Amniotic fluid is normal and good fetal activity seen. ? ?Gestational diabetes ?I explained the diagnosis of gestational diabetes.  I emphasized the importance of good blood glucose control to prevent adverse fetal or neonatal outcomes.  I discussed blood glucose normal values. I encouraged her to check her blood glucose regularly. ?Possible complications of gestational diabetes include fetal macrosomia, shoulder dystocia and birth injuries, stillbirth (in poorly controlled diabetes) and neonatal respiratory syndrome and other complications. ? ?Modification of diet helps in improving hyperglycemia.  Exercise reduces the need for insulin.  Medical treatment includes oral hypoglycemics or insulin. ? ?Timing of delivery: In well-controlled diabetes on diet, patient can be delivered at 46- or 40-weeks' gestation. Vaginal delivery is not contraindicated. ?Type 2 diabetes develops in up to 50% of women with GDM. I recommend postpartum screening with 75-g glucose load at 6 to 12 weeks after delivery. ?Recommendations ?-An appointment was made for her to return in 3 weeks for BPP and in 4 weeks for fetal growth assessment. ?-Weekly BPP or NST to be performed at your office till her next visit with [place]. ? ?Thank you for consultation.  If you have any questions or concerns, please contact me the [facility].  Consultation including  face-to-face counseling (more than 50% of time spent) is 30 minutes. ? ? ? ? ?

## 2022-02-22 ENCOUNTER — Other Ambulatory Visit: Payer: Self-pay

## 2022-02-22 DIAGNOSIS — K219 Gastro-esophageal reflux disease without esophagitis: Secondary | ICD-10-CM

## 2022-02-22 DIAGNOSIS — O24419 Gestational diabetes mellitus in pregnancy, unspecified control: Secondary | ICD-10-CM

## 2022-02-22 DIAGNOSIS — O09523 Supervision of elderly multigravida, third trimester: Secondary | ICD-10-CM

## 2022-02-22 DIAGNOSIS — O99213 Obesity complicating pregnancy, third trimester: Secondary | ICD-10-CM

## 2022-02-22 DIAGNOSIS — Z8759 Personal history of other complications of pregnancy, childbirth and the puerperium: Secondary | ICD-10-CM

## 2022-02-24 ENCOUNTER — Ambulatory Visit: Payer: Self-pay | Attending: Maternal & Fetal Medicine

## 2022-02-24 ENCOUNTER — Other Ambulatory Visit: Payer: Self-pay

## 2022-02-24 DIAGNOSIS — O99613 Diseases of the digestive system complicating pregnancy, third trimester: Secondary | ICD-10-CM

## 2022-02-24 DIAGNOSIS — O09523 Supervision of elderly multigravida, third trimester: Secondary | ICD-10-CM

## 2022-02-24 DIAGNOSIS — E669 Obesity, unspecified: Secondary | ICD-10-CM

## 2022-02-24 DIAGNOSIS — Z8759 Personal history of other complications of pregnancy, childbirth and the puerperium: Secondary | ICD-10-CM

## 2022-02-24 DIAGNOSIS — Z3A35 35 weeks gestation of pregnancy: Secondary | ICD-10-CM | POA: Insufficient documentation

## 2022-02-24 DIAGNOSIS — K219 Gastro-esophageal reflux disease without esophagitis: Secondary | ICD-10-CM

## 2022-02-24 DIAGNOSIS — O0943 Supervision of pregnancy with grand multiparity, third trimester: Secondary | ICD-10-CM | POA: Insufficient documentation

## 2022-02-24 DIAGNOSIS — O99213 Obesity complicating pregnancy, third trimester: Secondary | ICD-10-CM | POA: Insufficient documentation

## 2022-02-24 DIAGNOSIS — O24419 Gestational diabetes mellitus in pregnancy, unspecified control: Secondary | ICD-10-CM | POA: Insufficient documentation

## 2022-02-28 ENCOUNTER — Inpatient Hospital Stay
Admission: EM | Admit: 2022-02-28 | Discharge: 2022-03-02 | DRG: 798 | Disposition: A | Discharge status: Home / Self Care | Payer: Medicaid Other | Attending: Obstetrics | Admitting: Obstetrics

## 2022-02-28 ENCOUNTER — Inpatient Hospital Stay: Payer: Medicaid Other | Admitting: Anesthesiology

## 2022-02-28 ENCOUNTER — Other Ambulatory Visit: Payer: Self-pay

## 2022-02-28 ENCOUNTER — Encounter: Payer: Self-pay | Admitting: Obstetrics and Gynecology

## 2022-02-28 DIAGNOSIS — Z302 Encounter for sterilization: Secondary | ICD-10-CM | POA: Diagnosis not present

## 2022-02-28 DIAGNOSIS — O42913 Preterm premature rupture of membranes, unspecified as to length of time between rupture and onset of labor, third trimester: Principal | ICD-10-CM | POA: Diagnosis present

## 2022-02-28 DIAGNOSIS — Z23 Encounter for immunization: Secondary | ICD-10-CM | POA: Diagnosis not present

## 2022-02-28 DIAGNOSIS — Z3A36 36 weeks gestation of pregnancy: Secondary | ICD-10-CM

## 2022-02-28 DIAGNOSIS — O24425 Gestational diabetes mellitus in childbirth, controlled by oral hypoglycemic drugs: Secondary | ICD-10-CM | POA: Diagnosis present

## 2022-02-28 DIAGNOSIS — O9081 Anemia of the puerperium: Secondary | ICD-10-CM | POA: Diagnosis not present

## 2022-02-28 DIAGNOSIS — O4290 Premature rupture of membranes, unspecified as to length of time between rupture and onset of labor, unspecified weeks of gestation: Principal | ICD-10-CM | POA: Diagnosis present

## 2022-02-28 LAB — CBC
HCT: 33.1 % — ABNORMAL LOW (ref 36.0–46.0)
Hemoglobin: 11.1 g/dL — ABNORMAL LOW (ref 12.0–15.0)
MCH: 29.8 pg (ref 26.0–34.0)
MCHC: 33.5 g/dL (ref 30.0–36.0)
MCV: 88.7 fL (ref 80.0–100.0)
Platelets: 130 10*3/uL — ABNORMAL LOW (ref 150–400)
RBC: 3.73 MIL/uL — ABNORMAL LOW (ref 3.87–5.11)
RDW: 13.2 % (ref 11.5–15.5)
WBC: 6.9 10*3/uL (ref 4.0–10.5)
nRBC: 0 % (ref 0.0–0.2)

## 2022-02-28 LAB — TYPE AND SCREEN
ABO/RH(D): A POS
Antibody Screen: NEGATIVE

## 2022-02-28 LAB — GROUP B STREP BY PCR: Group B strep by PCR: NEGATIVE

## 2022-02-28 LAB — RPR: RPR Ser Ql: NONREACTIVE

## 2022-02-28 MED ORDER — SIMETHICONE 80 MG PO CHEW
80.0000 mg | CHEWABLE_TABLET | ORAL | Status: DC | PRN
Start: 2022-02-28 — End: 2022-03-02

## 2022-02-28 MED ORDER — PHENYLEPHRINE 80 MCG/ML (10ML) SYRINGE FOR IV PUSH (FOR BLOOD PRESSURE SUPPORT): 80.0000 ug | PREFILLED_SYRINGE | INTRAVENOUS | Status: DC | PRN

## 2022-02-28 MED ORDER — FLEET ENEMA 7-19 GM/118ML RE ENEM: 1.0000 | ENEMA | Freq: Every day | RECTAL | Status: DC | PRN

## 2022-02-28 MED ORDER — LACTATED RINGERS IV SOLN
500.0000 mL | INTRAVENOUS | Status: DC | PRN
  Administered 2022-02-28: 500 mL via INTRAVENOUS

## 2022-02-28 MED ORDER — ONDANSETRON HCL 4 MG PO TABS: 4.0000 mg | ORAL_TABLET | ORAL | Status: DC | PRN

## 2022-02-28 MED ORDER — AMMONIA AROMATIC IN INHA
RESPIRATORY_TRACT | Status: AC
  Filled 2022-02-28: qty 10

## 2022-02-28 MED ORDER — VARICELLA VIRUS VACCINE LIVE 1350 PFU/0.5ML IJ SUSR
0.5000 mL | INTRAMUSCULAR | Status: AC | PRN
  Administered 2022-03-02: 0.5 mL via SUBCUTANEOUS
  Filled 2022-02-28 (×2): qty 0.5

## 2022-02-28 MED ORDER — TERBUTALINE SULFATE 1 MG/ML IJ SOLN: 0.2500 mg | Freq: Once | INTRAMUSCULAR | Status: DC | PRN

## 2022-02-28 MED ORDER — OXYTOCIN-SODIUM CHLORIDE 30-0.9 UT/500ML-% IV SOLN
2.5000 [IU]/h | INTRAVENOUS | Status: DC
  Administered 2022-02-28: 2.5 [IU]/h via INTRAVENOUS
  Filled 2022-02-28: qty 500

## 2022-02-28 MED ORDER — SENNOSIDES-DOCUSATE SODIUM 8.6-50 MG PO TABS
2.0000 | ORAL_TABLET | ORAL | Status: DC
  Administered 2022-02-28 – 2022-03-01 (×2): 2 via ORAL
  Filled 2022-02-28 (×2): qty 2

## 2022-02-28 MED ORDER — LIDOCAINE HCL (PF) 1 % IJ SOLN
30.0000 mL | INTRAMUSCULAR | Status: DC | PRN
  Filled 2022-02-28: qty 30

## 2022-02-28 MED ORDER — COCONUT OIL OIL: 1.0000 "application " | TOPICAL_OIL | Status: DC | PRN

## 2022-02-28 MED ORDER — SODIUM CHLORIDE 0.9% FLUSH: 3.0000 mL | Freq: Two times a day (BID) | INTRAVENOUS | Status: DC

## 2022-02-28 MED ORDER — METHYLERGONOVINE MALEATE 0.2 MG/ML IJ SOLN
INTRAMUSCULAR | Status: AC
  Filled 2022-02-28: qty 1

## 2022-02-28 MED ORDER — LACTATED RINGERS IV SOLN: INTRAVENOUS | Status: DC

## 2022-02-28 MED ORDER — DIPHENHYDRAMINE HCL 50 MG/ML IJ SOLN: 12.5000 mg | INTRAMUSCULAR | Status: DC | PRN

## 2022-02-28 MED ORDER — DIBUCAINE (PERIANAL) 1 % EX OINT: 1.0000 "application " | TOPICAL_OINTMENT | CUTANEOUS | Status: DC | PRN

## 2022-02-28 MED ORDER — ACETAMINOPHEN 325 MG PO TABS
650.0000 mg | ORAL_TABLET | ORAL | Status: DC | PRN
  Administered 2022-02-28: 650 mg via ORAL
  Filled 2022-02-28: qty 2

## 2022-02-28 MED ORDER — FENTANYL-BUPIVACAINE-NACL 0.5-0.125-0.9 MG/250ML-% EP SOLN
EPIDURAL | Status: AC
  Filled 2022-02-28: qty 250

## 2022-02-28 MED ORDER — OXYTOCIN BOLUS FROM INFUSION
333.0000 mL | Freq: Once | INTRAVENOUS | Status: AC
  Administered 2022-02-28: 333 mL via INTRAVENOUS

## 2022-02-28 MED ORDER — WITCH HAZEL-GLYCERIN EX PADS: 1.0000 "application " | MEDICATED_PAD | CUTANEOUS | Status: DC | PRN

## 2022-02-28 MED ORDER — EPHEDRINE 5 MG/ML INJ
10.0000 mg | INTRAVENOUS | Status: DC | PRN
  Filled 2022-02-28: qty 4

## 2022-02-28 MED ORDER — SODIUM CHLORIDE 0.9 % IV SOLN: 250.0000 mL | INTRAVENOUS | Status: DC | PRN

## 2022-02-28 MED ORDER — LACTATED RINGERS IV SOLN
500.0000 mL | Freq: Once | INTRAVENOUS | Status: AC
  Administered 2022-02-28: 500 mL via INTRAVENOUS

## 2022-02-28 MED ORDER — ONDANSETRON HCL 4 MG/2ML IJ SOLN: 4.0000 mg | INTRAMUSCULAR | Status: DC | PRN

## 2022-02-28 MED ORDER — CARBOPROST TROMETHAMINE 250 MCG/ML IM SOLN
INTRAMUSCULAR | Status: AC
  Filled 2022-02-28: qty 1

## 2022-02-28 MED ORDER — PENICILLIN G POT IN DEXTROSE 60000 UNIT/ML IV SOLN: 3.0000 10*6.[IU] | INTRAVENOUS | Status: DC

## 2022-02-28 MED ORDER — OXYCODONE-ACETAMINOPHEN 5-325 MG PO TABS: 1.0000 | ORAL_TABLET | ORAL | Status: DC | PRN

## 2022-02-28 MED ORDER — OXYCODONE-ACETAMINOPHEN 5-325 MG PO TABS: 2.0000 | ORAL_TABLET | ORAL | Status: DC | PRN

## 2022-02-28 MED ORDER — OXYTOCIN 10 UNIT/ML IJ SOLN
INTRAMUSCULAR | Status: AC
  Filled 2022-02-28: qty 2

## 2022-02-28 MED ORDER — ACETAMINOPHEN 325 MG PO TABS
650.0000 mg | ORAL_TABLET | ORAL | Status: DC | PRN
  Administered 2022-03-01 (×2): 650 mg via ORAL
  Filled 2022-02-28 (×2): qty 2

## 2022-02-28 MED ORDER — SODIUM CHLORIDE 0.9 % IV SOLN
5.0000 10*6.[IU] | Freq: Once | INTRAVENOUS | Status: AC
  Administered 2022-02-28: 5 10*6.[IU] via INTRAVENOUS
  Filled 2022-02-28: qty 5

## 2022-02-28 MED ORDER — FENTANYL-BUPIVACAINE-NACL 0.5-0.125-0.9 MG/250ML-% EP SOLN
12.0000 mL/h | EPIDURAL | Status: DC | PRN
  Administered 2022-02-28: 12 mL/h via EPIDURAL

## 2022-02-28 MED ORDER — BUPIVACAINE HCL (PF) 0.25 % IJ SOLN
INTRAMUSCULAR | Status: DC | PRN
  Administered 2022-02-28: 8 mL via EPIDURAL

## 2022-02-28 MED ORDER — PRENATAL MULTIVITAMIN CH
1.0000 | ORAL_TABLET | Freq: Every day | ORAL | Status: DC
  Administered 2022-03-02: 1 via ORAL
  Filled 2022-02-28: qty 1

## 2022-02-28 MED ORDER — METOCLOPRAMIDE HCL 10 MG PO TABS
10.0000 mg | ORAL_TABLET | Freq: Once | ORAL | Status: AC
  Administered 2022-03-01: 10 mg via ORAL
  Filled 2022-02-28 (×2): qty 1

## 2022-02-28 MED ORDER — OXYTOCIN-SODIUM CHLORIDE 30-0.9 UT/500ML-% IV SOLN
1.0000 m[IU]/min | INTRAVENOUS | Status: DC
  Administered 2022-02-28: 2 m[IU]/min via INTRAVENOUS

## 2022-02-28 MED ORDER — FAMOTIDINE 20 MG PO TABS
40.0000 mg | ORAL_TABLET | Freq: Once | ORAL | Status: AC
  Administered 2022-03-01: 40 mg via ORAL
  Filled 2022-02-28: qty 2

## 2022-02-28 MED ORDER — ONDANSETRON HCL 4 MG/2ML IJ SOLN: 4.0000 mg | Freq: Four times a day (QID) | INTRAMUSCULAR | Status: DC | PRN

## 2022-02-28 MED ORDER — BISACODYL 10 MG RE SUPP
10.0000 mg | Freq: Every day | RECTAL | Status: DC | PRN
Start: 2022-02-28 — End: 2022-03-02

## 2022-02-28 MED ORDER — DIPHENHYDRAMINE HCL 25 MG PO CAPS: 25.0000 mg | ORAL_CAPSULE | Freq: Four times a day (QID) | ORAL | Status: DC | PRN

## 2022-02-28 MED ORDER — LIDOCAINE-EPINEPHRINE (PF) 1.5 %-1:200000 IJ SOLN
INTRAMUSCULAR | Status: DC | PRN
  Administered 2022-02-28: 3 mL via EPIDURAL

## 2022-02-28 MED ORDER — BENZOCAINE-MENTHOL 20-0.5 % EX AERO: 1.0000 "application " | INHALATION_SPRAY | CUTANEOUS | Status: DC | PRN

## 2022-02-28 MED ORDER — IBUPROFEN 600 MG PO TABS
600.0000 mg | ORAL_TABLET | Freq: Four times a day (QID) | ORAL | Status: DC
  Administered 2022-02-28 – 2022-03-02 (×6): 600 mg via ORAL
  Filled 2022-02-28 (×6): qty 1

## 2022-02-28 MED ORDER — ZOLPIDEM TARTRATE 5 MG PO TABS: 5.0000 mg | ORAL_TABLET | Freq: Every evening | ORAL | Status: DC | PRN

## 2022-02-28 MED ORDER — MISOPROSTOL 200 MCG PO TABS
ORAL_TABLET | ORAL | Status: AC
  Filled 2022-02-28: qty 4

## 2022-02-28 MED ORDER — EPHEDRINE 5 MG/ML INJ
10.0000 mg | INTRAVENOUS | Status: DC | PRN
  Administered 2022-02-28: 10 mg via INTRAVENOUS

## 2022-02-28 MED ORDER — SOD CITRATE-CITRIC ACID 500-334 MG/5ML PO SOLN: 30.0000 mL | ORAL | Status: DC | PRN

## 2022-02-28 MED ORDER — SODIUM CHLORIDE 0.9% FLUSH: 3.0000 mL | INTRAVENOUS | Status: DC | PRN

## 2022-02-28 MED ORDER — OXYCODONE HCL 5 MG PO TABS: 5.0000 mg | ORAL_TABLET | ORAL | Status: DC | PRN

## 2022-02-28 NOTE — H&P (Signed)
OB History & Physical  ? ?History of Present Illness:  ?Chief Complaint:  ? ?HPI:  ?Robin Barr is a 41 y.o. G61P1005 female at [redacted]w[redacted]d dated by LMP.  She presents to L&D for leak ? ?She reports:  ?-active fetal movement ?-LOF/SROM at 0630 ?-no vaginal bleeding ?-no contractions ? ?Pregnancy Issues: ?1. GDM ?2. AMA ?3. Hx of Precip delivery ? ? ?Maternal Medical History:  ? ?Past Medical History:  ?Diagnosis Date  ? Medical history non-contributory   ? ? ?Past Surgical History:  ?Procedure Laterality Date  ? NO PAST SURGERIES    ? ? ?No Known Allergies ? ?Prior to Admission medications   ?Medication Sig Start Date End Date Taking? Authorizing Provider  ?ferrous sulfate 325 (65 FE) MG tablet Take 325 mg by mouth daily with breakfast. Pt taking as liquid   Yes [provider]  ?metFORMIN (GLUCOPHAGE) 500 MG tablet Take 1,000 mg by mouth 2 (two) times daily with a meal.   Yes [provider]  ?Prenatal Vit-Fe Fumarate-FA (PRENATAL MULTIVITAMIN) TABS tablet Take 1 tablet by mouth daily at 12 noon.   Yes [provider]  ?acetaminophen (TYLENOL) 500 MG tablet Take 1 tablet (500 mg total) by mouth every 6 (six) hours as needed. 05/06/19   Enid Derry, PA-C  ? ? ? ?Prenatal care site: [facility] ? ?Social History: She  reports that she has never smoked. She has never used smokeless tobacco. She reports that she does not drink alcohol and does not use drugs. ? ?Family History: family history includes Cancer in her mother; Heart disease in her paternal grandfather; Hypertension in her mother; Thyroid disease in her sister.  ? ?Review of Systems: A full review of systems was performed and negative except as noted in the HPI.   ? ?Physical Exam:  ?Vital Signs: BP 125/82 (BP Location: Right Arm)   Pulse 94   Temp 97.9 ?F (36.6 ?C) (Oral)   Resp 15   Ht 5' (1.524 m)   Wt 75.8 kg   LMP 06/20/2021 (Exact Date)   BMI 32.61 kg/m?  ? ?General:   alert, cooperative, and appears stated age   ?Skin:  normal  ?Neurologic:    Alert & oriented x 3  ?Lungs:   clear to auscultation bilaterally  ?Heart:   regular rate and rhythm, S1, S2 normal, no murmur, click, rub or gallop  ?Abdomen:  soft, non-tender; bowel sounds normal; no masses,  no organomegaly and Gravid  ?Pelvis:  Exam deferred.  ?FHT:  155 BPM  ?Presentations: cephalic  ?Cervix:   ? Dilation: 2.5  ? Effacement: Thick  ? Station:  -3  ? Consistency: firm  ? Position: posterior  ?Extremities: : non-tender, symmetric, no edema bilaterally.  DTRs: +2   ? ?EFW: 6 lbs ? ? ?Pertinent Results:  ?Prenatal Labs: ?Blood type/Rh A Pos  ?Antibody screen neg  ?Rubella Immune  ?Varicella Immune  ?RPR NR  ?HBsAg Neg  ?HIV NR  ?GC neg  ?Chlamydia neg  ?Genetic screening negative  ?1 hour GTT 169  ?3 hour GTT   ?GBS Pending  ? ?FHT: FHR: 155 bpm, variability: moderate,  accelerations:  Present,  decelerations:  Present early ?Category/reactivity:  Category I ?TOCO: occasional ?SVE: Dilation: 2.5 / Effacement (%): Thick / Station: -3  ?  ? ?[place] MFM FETAL BPP WO NON STRESS ? ?Result Date: 02/24/2022 ?----------------------------------------------------------------------  OBSTETRICS REPORT                        (  Signed Final 02/24/2022 03:36 pm) ---------------------------------------------------------------------- Patient Info  ID #:       [id number]                          D.O.B.:  15-Nov-1980 (40 yrs)  Name:       Robin Barr                  Visit Date: 02/24/2022 11:42 am              Robin Barr ---------------------------------------------------------------------- Performed By  Attending:        Lin Landsman      Referred By:       [facility]                    MD                                        [facility]  Performed By:     Birdena Crandall        [facility]                                  [facility] at                                                              [facility]  ---------------------------------------------------------------------- Orders  #  Description                           Code        Ordered By  1  [place] MFM FETAL BPP WO NON               76819.01    [facility]     STRESS                                            BOOKER ----------------------------------------------------------------------  #  Order #                     Accession #                Episode #  1  [id number]                   [id number]                 [id number] ---------------------------------------------------------------------- Indications  Gestational diabetes in pregnancy,              O24.419  unspecified control  Advanced maternal age multigravida 58+,         O84.523  third trimester  Grand multiparity, antepartum  O09.40  Obesity complicating pregnancy, third           O99.213  trimester  [redacted] weeks gestation of pregnancy                 Z3A.35 ---------------------------------------------------------------------- Fetal Evaluation  Num Of Fetuses:          1  Fetal Heart Rate(bpm):   158  Cardiac Activity:        Observed  Presentation:            Cephalic  Placenta:                Posterior  P. Cord Insertion:       Visualized, central  Amniotic Fluid  AFI FV:      Within normal limits  AFI Sum(cm)     %Tile       Largest Pocket(cm)  15              55          5.5  RUQ(cm)       RLQ(cm)       LUQ(cm)        LLQ(cm)  3.7           3             5.5            2.8 ---------------------------------------------------------------------- Biophysical Evaluation  Amniotic F.V:   Within normal limits       F. Tone:         Observed  F. Movement:    Observed                   Score:           8/8  F. Breathing:   Observed ---------------------------------------------------------------------- OB History  Blood Type:   A+  Gravidity:    6         Term:   5  Living:       5 ---------------------------------------------------------------------- Gestational Age  LMP:           35w 4d         Date:  06/20/21                   EDD:   03/27/22  Best:          35w 4d     Det. By:  LMP  (06/20/21)          EDD:   03/27/22 ---------------------------------------------------------------------- Anatomy  Cranium:               Appears normal         Diaphragm:              Appears normal  Cavum:                 Appears normal         Stomach:                Appears normal, left                                                                        sided  Ventricles:  Appears normal         Kidneys:                Appear normal  Heart:                 Appears normal         Bladder:                Appears normal                         (4CH, axis, and                         situs) ---------------------------------------------------------------------- Impression  Antenatal testing performed given maternal GDM and AMA  The biophysical profile was 8/8 with good fetal movement and  amniotic fluid volume. ---------------------------------------------------------------------- Recommendations  Continue weekly testing and serial growth as previously  scheduled. ----------------------------------------------------------------------              Lin Landsmanorenthian Booker, MD Electronically Signed Final Report   02/24/2022 03:36 pm ---------------------------------------------------------------------- ? ?US MFM OB FOLLOW UP ? ?Result Date: 02/01/2022 ?----------------------------------------------------------------------  OBSTETRICS REPORT                       (Signed Final 02/01/2022 04:08 pm) ---------------------------------------------------------------------- Patient Info  ID #:       [id number]                          D.O.B.:  06/09/1981 (40 yrs)  Name:       Vangie BickerADRIANA Barr                  Visit Date: 02/01/2022 02:20 pm              Robin Barr ---------------------------------------------------------------------- Performed By  Attending:        Noralee Spaceavi Shankar MD        Referred By:      [facility]                                                              [facility]  Performed By:     Joanna HewsAlexis Metcalf         Location:         [facility]                    RDMS                                     F[facility] Care at

## 2022-02-28 NOTE — Anesthesia Preprocedure Evaluation (Addendum)
Anesthesia Evaluation  ?Patient identified by MRN, date of birth, ID band ?Patient awake ? ? ? ?Reviewed: ?Allergy & Precautions, NPO status , Patient's Chart, lab work & pertinent test results ? ?History of Anesthesia Complications ?Negative for: history of anesthetic complications ? ?Airway ?Mallampati: IV ? ? ?Neck ROM: Full ? ? ? Dental ? ?(+) Chipped ?  ?Pulmonary ?neg pulmonary ROS,  ?  ?Pulmonary exam normal ?breath sounds clear to auscultation ? ? ? ? ? ? Cardiovascular ?Exercise Tolerance: Good ?negative cardio ROS ?Normal cardiovascular exam ?Rhythm:Regular Rate:Normal ? ? ?  ?Neuro/Psych ?negative neurological ROS ?   ? GI/Hepatic ?negative GI ROS,   ?Endo/Other  ?diabetes, Gestational ? Renal/GU ?negative Renal ROS  ? ?  ?Musculoskeletal ? ? Abdominal ?  ?Peds ? Hematology ?negative hematology ROS ?(+)   ?Anesthesia Other Findings ? ? Reproductive/Obstetrics ? ?  ? ? ? ? ? ? ? ? ? ? ? ? ? ?  ?  ? ? ? ? ? ? ? ?Anesthesia Physical ?Anesthesia Plan ? ?ASA: 2 ? ?Anesthesia Plan: Epidural  ? ?Post-op Pain Management:   ? ?Induction:  ? ?PONV Risk Score and Plan: 2 and Treatment may vary due to age or medical condition ? ?Airway Management Planned: Natural Airway ? ?Additional Equipment:  ? ?Intra-op Plan:  ? ?Post-operative Plan:  ? ?Informed Consent: I have reviewed the patients History and Physical, chart, labs and discussed the procedure including the risks, benefits and alternatives for the proposed anesthesia with the patient or authorized representative who has indicated his/her understanding and acceptance.  ? ? ? ?Dental Advisory Given ? ?Plan Discussed with:  ? ?Anesthesia Plan Comments: (History and consent obtained in Spanish.  Patient reports no bleeding problems and no anticoagulant use. ? ? ?Patient consented for risks of anesthesia including but not limited to:  ?- adverse reactions to medications ?- risk of bleeding, infection and or nerve damage from epidural  that could lead to paralysis ?- risk of headache or failed epidural ?- nerve damage due to positioning ?- that if epidural is used for C-section that there is a chance of epidural failure requiring spinal placement or conversion to GA ?- Damage to heart, brain, lungs, other parts of body or loss of life ? ?Patient voiced understanding.)  ? ? ? ? ? ?Anesthesia Quick Evaluation ? ?

## 2022-02-28 NOTE — OB Triage Note (Signed)
Patient is a G6P5 at [redacted]w[redacted]d who presents to unit c/o LOF with clear fluid that began around 0630. Reports +FM, denies vaginal bleeding and ctx. Patient reports receiving prenatal care at [facility] and being diagnosed with GDM and anemia this pregnancy. External monitors applied and assessing. Initial FHT 140. Vital signs WDL.  ?

## 2022-02-28 NOTE — Progress Notes (Signed)
Labor Progress Note ? ?Robin Barr is a 41 y.o. G6P1005 at [redacted]w[redacted]d by LMP admitted for PPROM ? ?Subjective: resting in bed after epidural placement ? ?Objective: ?BP (!) 111/59   Pulse 82   Temp 98.4 ?F (36.9 ?C) (Oral)   Resp 16   Ht 5' (1.524 m)   Wt 75.8 kg   LMP 06/20/2021 (Exact Date)   SpO2 99%   BMI 32.61 kg/m?  ?Notable VS details:  ? ? ?Fetal Assessment: ?FHT:  FHR: 155 bpm, variability: moderate,  accelerations:  Present,  decelerations:  Present variables and early deceleration with good return to baseline after epidural ?Category/reactivity:  Category II ?UC:   regular, every 2-3 minutes ?SVE:   5/80/-1 ?Membrane status: SROM x 6.5 hours ?Amniotic color: Clear ? ?Labs: ?Lab Results  ?Component Value Date  ? WBC 6.9 02/28/2022  ? HGB 11.1 (L) 02/28/2022  ? HCT 33.1 (L) 02/28/2022  ? MCV 88.7 02/28/2022  ? PLT 130 (L) 02/28/2022  ? ? ?Assessment / Plan: ?Augmentation of labor progressing well with pitocin. Will titrate pitocin down due to variables after epidural placement. ?- 500 ml IV fluid bolus given ?-Will continue titration of Pitocin as appropriate ?-NICU aware of preterm labor ?-Continuous monitoring of maternal and fetal well being ? ?Labor:  Progressing normally ?Preeclampsia:   N/A ?Fetal Wellbeing:  Category II ?Pain Control:  Epidural ?I/D:   GBS neg, SROM x 6.5 hoiurs ?Anticipated MOD:  NSVD ? ? ?Avelino Leeds CNM ? ? ? ? ? ? ?  ?

## 2022-02-28 NOTE — Discharge Summary (Signed)
Obstetrical Discharge Summary ? ?Patient Name: Robin Barr ?DOB: April 01, 1981 ?MRN: [id number] ? ?Date of Admission: 02/28/2022 ?Date of Delivery: 02/28/22 ?Delivered by: Avelino Leeds, CNM  ?Date of Discharge: 03/02/2022 ? ?Primary OB: Princella Ion ?[id number] last menstrual period was 06/20/2021 (exact date). ?EDC Estimated Date of Delivery: 03/27/22 ?Gestational Age at Delivery: [redacted]w[redacted]d  ? ?Antepartum complications:  ?1.GDM ?2. AMA ?3.Hx of Precip delivery ?4. 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 Condyloma ?6. Varicella non-immune ? ?Admitting Diagnosis: Amniotic fluid leaking [O42.90]  ?Secondary Diagnosis: ?Patient Active Problem List  ? Diagnosis Date Noted  ? Amniotic fluid leaking 02/28/2022  ? Vaginal bleeding in pregnancy, second trimester 11/28/2021  ? Gastroesophageal reflux disease without esophagitis 08/12/2019  ? Pregnancy 05/01/2017  ? Fibrocystic disease of breast 11/24/2016  ? Precipitous delivery 05/22/2015  ? Condyloma 06/30/2014  ? ? ?Augmentation: Pitocin ?Complications: None ?Intrapartum complications/course: Patient presents to labor and delivery with PPROM at 36.1 weeks. She was given 1 dose of PCN for prophylaxis due to unknown GBS status.Patient then augmented with Pitocin titration ?Delivery Type: spontaneous vaginal delivery ?Anesthesia: epidural ?Placenta: spontaneous ?Laceration: none ?Episiotomy: none ?Newborn Data: ?Live born female  ?Birth Weight:  5lb 12.4oz ?APGAR: 9, 9 ? ?Newborn Delivery   ?Birth date/time: 02/28/2022 15:30:00 ?Delivery type: Vaginal, Spontaneous ?  ?  ? ?Postpartum Procedures: BTL on PPD 1 ?Edinburgh:  ? ?  03/01/2022  ?  2:27 PM  ?Edinburgh Postnatal Depression Scale Screening Tool  ?I have been able to laugh and see the funny side of things. 0  ?I have looked forward with enjoyment to things. 0  ?I have blamed myself unnecessarily when things went wrong. 0  ?I have been anxious or worried for no good reason. 0  ?I have felt scared or panicky for no  good reason. 0  ?Things have been getting on top of me. 0  ?I have been so unhappy that I have had difficulty sleeping. 0  ?I have felt sad or miserable. 0  ?I have been so unhappy that I have been crying. 0  ?The thought of harming myself has occurred to me. 0  ?Edinburgh Postnatal Depression Scale Total 0  ?  ? ?Post partum course:  ?Patient had an uncomplicated postpartum course.  By time of discharge on PPD#2, her pain was controlled on oral pain medications; she had appropriate lochia and was ambulating, voiding without difficulty and tolerating regular diet.  She was deemed stable for discharge to home.   ? ? ?Discharge Physical Exam:   ?BP 95/63 (BP Location: Right Arm)   Pulse 62   Temp 97.7 ?F (36.5 ?C) (Oral)   Resp 18   Ht 5' (1.524 m)   Wt 75.8 kg   LMP 06/20/2021 (Exact Date)   SpO2 100%   Breastfeeding Unknown   BMI 32.61 kg/m?  ? ?General: NAD ?CV: RRR ?Pulm: CTABL, nl effort ?ABD: s/nd/nt, fundus firm and below the umbilicus ?Lochia: moderate ?Perineum:minimal edema/intact ?DVT Evaluation: LE non-ttp, no evidence of DVT on exam. ? ?Hemoglobin  ?Date Value Ref Range Status  ?03/01/2022 10.2 (L) 12.0 - 15.0 g/dL Final  ?10/06/2016 11.9  Final  ? ?HCT  ?Date Value Ref Range Status  ?03/01/2022 30.7 (L) 36.0 - 46.0 % Final  ?10/06/2016 35  Final  ? ? ? ?Disposition: stable, discharge to home. ?Baby Feeding: breastmilk and formula ?Baby Disposition: home with mom ? ?Rh Immune globulin given: N/A ?Rubella vaccine given: N/A ?Varivax vaccine given: N/A ?Flu vaccine given in AP or  PP setting: declined ?Tdap vaccine given in AP or PP setting: 3/23 ? ?Contraception: BTL, consents signed 11/10/21 with CD, not faxed ? ?Prenatal Labs:  ?Blood type/Rh A Pos  ?Antibody screen neg  ?Rubella Immune  ?Varicella Immune  ?RPR NR  ?HBsAg Neg  ?HIV NR  ?GC neg  ?Chlamydia neg  ?Genetic screening negative  ?1 hour GTT 169  ?3 hour GTT  [id number]  ?GBS negative  ? ? ?Plan:  ?Teriann Bolle was  discharged to home in good condition. ?Follow-up appointment with delivering provider in 6 weeks. ? ?Discharge Medications: ?Allergies as of 03/02/2022   ?No Known Allergies ?  ? ?  ?Medication List  ?  ? ?STOP taking these medications   ? ?metFORMIN 500 MG tablet ?Commonly known as: GLUCOPHAGE ?  ? ?  ? ?TAKE these medications   ? ?acetaminophen 325 MG tablet ?Commonly known as: Tylenol ?Take 2 tablets (650 mg total) by mouth every 4 (four) hours as needed (for pain scale < 4). ?What changed:  ?medication strength ?how much to take ?when to take this ?reasons to take this ?  ?benzocaine-Menthol 20-0.5 % Aero ?Commonly known as: DERMOPLAST ?Apply 1 application. topically as needed for irritation (perineal discomfort). ?  ?dibucaine 1 % Oint ?Commonly known as: NUPERCAINAL ?Place 1 application. rectally as needed for hemorrhoids (if tucks not working). ?  ?ferrous sulfate 325 (65 FE) MG tablet ?Take 325 mg by mouth daily with breakfast. Pt taking as liquid ?  ?ibuprofen 600 MG tablet ?Commonly known as: ADVIL ?Take 1 tablet (600 mg total) by mouth every 6 (six) hours. ?  ?oxyCODONE 5 MG immediate release tablet ?Commonly known as: Oxy IR/ROXICODONE ?Take 1 tablet (5 mg total) by mouth every 4 (four) hours as needed for up to 5 days (pain scale 4-7). ?  ?prenatal multivitamin Tabs tablet ?Take 1 tablet by mouth daily at 12 noon. ?  ?witch hazel-glycerin pad ?Commonly known as: TUCKS ?Apply 1 application. topically as needed for hemorrhoids (for pain). ?  ? ?  ? ? ? Follow-up Information   ? ? Benjaman Kindler, MD Follow up in 2 week(s).   ?Specialty: Obstetrics and Gynecology ?Why: For postop check ?Contact information: ?[address] ?[place] [address] ?[phone] ? ? ?  ?  ? ? [facility] DEPT Follow up in 6 week(s).   ?Why: 6wk postpartum ?Contact information: ?[place] SSN[id number] ?[phone] ? ?  ?  ? ?  ?  ? ?  ? ? ?Signed: ?Gertie Fey, CNM ?03/02/2022 ?8:39 AM ? ?

## 2022-02-28 NOTE — Anesthesia Procedure Notes (Signed)
Epidural ?Patient location during procedure: OB ?Start time: 02/28/2022 12:50 PM ?End time: 02/28/2022 1:00 PM ? ?Staffing ?Anesthesiologist: Reed Breech, MD ?Performed: anesthesiologist  ? ?Preanesthetic Checklist ?Completed: patient identified, IV checked, risks and benefits discussed, surgical consent, monitors and equipment checked, pre-op evaluation and timeout performed ? ?Epidural ?Patient position: sitting ?Prep: Betadine ?Patient monitoring: heart rate, continuous pulse ox and blood pressure ?Approach: midline ?Location: L1-L2 ?Injection technique: LOR air ? ?Needle:  ?Needle type: Tuohy  ?Needle gauge: 17 G ?Needle length: 9 cm ?Needle insertion depth: 4.5 cm ?Catheter at skin depth: 9.5 cm ?Test dose: negative and 1.5% lidocaine with Epi 1:200 K ? ?Assessment ?Sensory level: T4 ? ?Additional Notes ?Two attempts, first unsuccessful due to inability to thread catheter.  Second attempt at L1-2 successful.Reason for block:procedure for pain ? ? ? ?

## 2022-03-01 ENCOUNTER — Encounter
Admission: EM | Disposition: A | Discharge status: Home / Self Care | Payer: Self-pay | Source: Home / Self Care | Attending: Obstetrics

## 2022-03-01 ENCOUNTER — Other Ambulatory Visit: Payer: Self-pay

## 2022-03-01 ENCOUNTER — Inpatient Hospital Stay: Payer: Medicaid Other | Admitting: Anesthesiology

## 2022-03-01 ENCOUNTER — Ambulatory Visit: Payer: Self-pay

## 2022-03-01 ENCOUNTER — Encounter: Payer: Self-pay | Admitting: Obstetrics and Gynecology

## 2022-03-01 HISTORY — PX: TUBAL LIGATION: SHX77

## 2022-03-01 LAB — CBC
HCT: 30.7 % — ABNORMAL LOW (ref 36.0–46.0)
Hemoglobin: 10.2 g/dL — ABNORMAL LOW (ref 12.0–15.0)
MCH: 30 pg (ref 26.0–34.0)
MCHC: 33.2 g/dL (ref 30.0–36.0)
MCV: 90.3 fL (ref 80.0–100.0)
Platelets: 127 10*3/uL — ABNORMAL LOW (ref 150–400)
RBC: 3.4 MIL/uL — ABNORMAL LOW (ref 3.87–5.11)
RDW: 13.2 % (ref 11.5–15.5)
WBC: 6.5 10*3/uL (ref 4.0–10.5)
nRBC: 0 % (ref 0.0–0.2)

## 2022-03-01 LAB — GLUCOSE, CAPILLARY: Glucose-Capillary: 81 mg/dL (ref 70–99)

## 2022-03-01 SURGERY — LIGATION, FALLOPIAN TUBE, POSTPARTUM
Anesthesia: Spinal | Site: Abdomen | Laterality: Bilateral

## 2022-03-01 MED ORDER — ONDANSETRON HCL 4 MG/2ML IJ SOLN: 4.0000 mg | Freq: Once | INTRAMUSCULAR | Status: DC | PRN

## 2022-03-01 MED ORDER — LACTATED RINGERS IV SOLN: INTRAVENOUS | Status: DC | PRN

## 2022-03-01 MED ORDER — ROCURONIUM BROMIDE 10 MG/ML (PF) SYRINGE
PREFILLED_SYRINGE | INTRAVENOUS | Status: AC
  Filled 2022-03-01: qty 10

## 2022-03-01 MED ORDER — PHENYLEPHRINE 80 MCG/ML (10ML) SYRINGE FOR IV PUSH (FOR BLOOD PRESSURE SUPPORT)
PREFILLED_SYRINGE | INTRAVENOUS | Status: AC
  Filled 2022-03-01: qty 10

## 2022-03-01 MED ORDER — BUPIVACAINE HCL 0.5 % IJ SOLN
INTRAMUSCULAR | Status: DC | PRN
  Administered 2022-03-01 (×2): 10 mL

## 2022-03-01 MED ORDER — 0.9 % SODIUM CHLORIDE (POUR BTL) OPTIME
TOPICAL | Status: DC | PRN
  Administered 2022-03-01: 1000 mL

## 2022-03-01 MED ORDER — MIDAZOLAM HCL 5 MG/5ML IJ SOLN
INTRAMUSCULAR | Status: DC | PRN
  Administered 2022-03-01: 1 mg via INTRAVENOUS

## 2022-03-01 MED ORDER — EPHEDRINE 5 MG/ML INJ
INTRAVENOUS | Status: AC
  Filled 2022-03-01: qty 5

## 2022-03-01 MED ORDER — DEXAMETHASONE SODIUM PHOSPHATE 10 MG/ML IJ SOLN
INTRAMUSCULAR | Status: AC
  Filled 2022-03-01: qty 1

## 2022-03-01 MED ORDER — FENTANYL CITRATE (PF) 100 MCG/2ML IJ SOLN
INTRAMUSCULAR | Status: AC
  Administered 2022-03-01: 25 ug via INTRAVENOUS
  Filled 2022-03-01: qty 2

## 2022-03-01 MED ORDER — FENTANYL CITRATE (PF) 100 MCG/2ML IJ SOLN
INTRAMUSCULAR | Status: AC
  Filled 2022-03-01: qty 2

## 2022-03-01 MED ORDER — KETOROLAC TROMETHAMINE 30 MG/ML IJ SOLN
INTRAMUSCULAR | Status: AC
  Filled 2022-03-01: qty 1

## 2022-03-01 MED ORDER — BUPIVACAINE IN DEXTROSE 0.75-8.25 % IT SOLN
INTRATHECAL | Status: DC | PRN
  Administered 2022-03-01: 1.4 mL via INTRATHECAL

## 2022-03-01 MED ORDER — MIDAZOLAM HCL 2 MG/2ML IJ SOLN
INTRAMUSCULAR | Status: AC
  Filled 2022-03-01: qty 2

## 2022-03-01 MED ORDER — PHENYLEPHRINE HCL-NACL 20-0.9 MG/250ML-% IV SOLN
INTRAVENOUS | Status: AC
  Filled 2022-03-01: qty 250

## 2022-03-01 MED ORDER — ONDANSETRON HCL 4 MG/2ML IJ SOLN
INTRAMUSCULAR | Status: AC
  Filled 2022-03-01: qty 2

## 2022-03-01 MED ORDER — BUPIVACAINE HCL (PF) 0.5 % IJ SOLN
INTRAMUSCULAR | Status: AC
  Filled 2022-03-01: qty 30

## 2022-03-01 MED ORDER — FENTANYL CITRATE (PF) 100 MCG/2ML IJ SOLN
25.0000 ug | INTRAMUSCULAR | Status: DC | PRN
  Administered 2022-03-01: 25 ug via INTRAVENOUS

## 2022-03-01 MED ORDER — LIDOCAINE HCL (PF) 2 % IJ SOLN
INTRAMUSCULAR | Status: AC
  Filled 2022-03-01: qty 5

## 2022-03-01 MED ORDER — SUGAMMADEX SODIUM 500 MG/5ML IV SOLN
INTRAVENOUS | Status: AC
  Filled 2022-03-01: qty 5

## 2022-03-01 MED ORDER — PROPOFOL 10 MG/ML IV BOLUS
INTRAVENOUS | Status: AC
  Filled 2022-03-01: qty 20

## 2022-03-01 SURGICAL SUPPLY — 41 items
ADH SKN CLS APL DERMABOND .7 (GAUZE/BANDAGES/DRESSINGS) ×1
APL PRP STRL LF DISP 70% ISPRP (MISCELLANEOUS) ×1
BACTOSHIELD CHG 4% 4OZ (MISCELLANEOUS)
BLADE SURG SZ11 CARB STEEL (BLADE) ×3 IMPLANT
CATH ROBINSON RED A/P 16FR (CATHETERS) ×1 IMPLANT
CHLORAPREP W/TINT 26 (MISCELLANEOUS) ×3 IMPLANT
DERMABOND ADVANCED (GAUZE/BANDAGES/DRESSINGS) ×1
DERMABOND ADVANCED .7 DNX12 (GAUZE/BANDAGES/DRESSINGS) ×2 IMPLANT
DRAPE LAPAROTOMY 100X77 ABD (DRAPES) ×3 IMPLANT
ELECT REM PT RETURN 9FT ADLT (ELECTROSURGICAL) ×2
ELECTRODE REM PT RTRN 9FT ADLT (ELECTROSURGICAL) ×2 IMPLANT
GAUZE 4X4 16PLY ~~LOC~~+RFID DBL (SPONGE) ×3 IMPLANT
GLOVE BIO SURGEON STRL SZ7 (GLOVE) ×3 IMPLANT
GLOVE SURG UNDER LTX SZ7.5 (GLOVE) ×3 IMPLANT
GOWN STRL REUS W/ TWL LRG LVL3 (GOWN DISPOSABLE) ×2 IMPLANT
GOWN STRL REUS W/TWL LRG LVL3 (GOWN DISPOSABLE) ×2
KIT TURNOVER CYSTO (KITS) ×3 IMPLANT
LABEL OR SOLS (LABEL) ×3 IMPLANT
LIGASURE IMPACT 36 18CM CVD LR (INSTRUMENTS) ×1 IMPLANT
MANIFOLD NEPTUNE II (INSTRUMENTS) ×3 IMPLANT
NEEDLE HYPO 22GX1.5 SAFETY (NEEDLE) ×3 IMPLANT
NS IRRIG 500ML POUR BTL (IV SOLUTION) ×3 IMPLANT
PACK BASIN MINOR ARMC (MISCELLANEOUS) ×3 IMPLANT
PAD OB MATERNITY 4.3X12.25 (PERSONAL CARE ITEMS) ×3 IMPLANT
RETRACTOR RING XSMALL (MISCELLANEOUS) IMPLANT
RETRACTOR WOUND ALXS 18CM SML (MISCELLANEOUS) ×2 IMPLANT
RTRCTR WOUND ALEXIS 13CM XS SH (MISCELLANEOUS) ×2
RTRCTR WOUND ALEXIS O 18CM SML (MISCELLANEOUS) ×2
SCRUB CHG 4% DYNA-HEX 4OZ (MISCELLANEOUS) ×2 IMPLANT
SOL SCRUB PVP POV-IOD 4OZ 7.5% (MISCELLANEOUS) ×2
SOLUTION SCRB POV-IOD 4OZ 7.5% (MISCELLANEOUS) ×2 IMPLANT
SPONGE T-LAP 4X18 ~~LOC~~+RFID (SPONGE) ×2 IMPLANT
SUT CHROMIC GUT BROWN 0 54 (SUTURE) ×2 IMPLANT
SUT CHROMIC GUT BROWN 0 54IN (SUTURE) ×2
SUT MNCRL 4-0 (SUTURE) ×2
SUT MNCRL 4-0 27XMFL (SUTURE) ×1
SUT VIC AB 0 CT2 27 (SUTURE) ×3 IMPLANT
SUT VICRYL 0 AB UR-6 (SUTURE) ×6 IMPLANT
SUTURE MNCRL 4-0 27XMF (SUTURE) ×2 IMPLANT
SYR 10ML LL (SYRINGE) ×3 IMPLANT
WATER STERILE IRR 500ML POUR (IV SOLUTION) ×3 IMPLANT

## 2022-03-01 NOTE — Progress Notes (Signed)
Post Partum Day 1 ?Subjective: ?Doing well, no complaints.  Tolerating regular diet, pain with PO meds, voiding and ambulating without difficulty. ? ?No CP SOB Fever,Chills, N/V or leg pain; denies nipple or breast pain; no HA change of vision, RUQ/epigastric pain ? ?Objective: ?BP 104/64   Pulse 70   Temp 97.7 ?F (36.5 ?C) (Oral)   Resp 16   Ht 5' (1.524 m)   Wt 75.8 kg   LMP 06/20/2021 (Exact Date)   SpO2 96%   Breastfeeding Unknown   BMI 32.61 kg/m?  ?  ?Vitals:  ? 02/28/22 1617 02/28/22 1633 02/28/22 1646 02/28/22 1723  ?BP: (!) 103/47 107/69 97/67 114/65  ? 02/28/22 1731 02/28/22 1745 02/28/22 1800 02/28/22 1844  ?BP: (!) 117/102 126/76 119/77 109/68  ? 02/28/22 1945 02/28/22 2306 03/01/22 0230 03/01/22 0736  ?BP: 109/68 (!) 97/58 95/65 104/64  ? ? ? ?Physical Exam:  ?General: NAD ?Breasts: soft/nontender ?CV: RRR ?Pulm: nl effort, CTABL ?Abdomen: soft, NT, BS x 4 ?Perineum: minimal edema, intact ?Lochia: small ?Uterine Fundus: fundus firm and 1 fb below umbilicus ?DVT Evaluation: no cords, ttp LEs  ? ?Recent Labs  ?  02/28/22 ?0758 03/01/22 ?1093  ?HGB 11.1* 10.2*  ?HCT 33.1* 30.7*  ?WBC 6.9 6.5  ?PLT 130* 127*  ? ? ?Assessment/Plan: ?41 y.o. [id number] postpartum day # 1 ? ?- Continue routine PP care ?- Lactation consult prn ?- PP BTL planned to be done today.   ?- Acute blood loss anemia - hemodynamically stable and asymptomatic; DC home with po ferrous sulfate BID with stool softeners  ? ? ? ?Disposition: Does not desire Dc home today.  ? ? ? ?Randa Ngo, CNM ?03/01/2022  ?8:59 AM ? ? ? ? ?

## 2022-03-01 NOTE — Transfer of Care (Signed)
Immediate Anesthesia Transfer of Care Note ? ?Patient: Robin Barr ? ?Procedure(s) Performed: POST PARTUM TUBAL LIGATION (Bilateral: Abdomen) ? ?Patient Location: PACU ? ?Anesthesia Type:Spinal ? ?Level of Consciousness: awake, alert  and oriented ? ?Airway & Oxygen Therapy: Patient Spontanous Breathing ? ?Post-op Assessment: Report given to RN and Post -op Vital signs reviewed and stable ? ?Post vital signs: Reviewed and stable ? ?Last Vitals:  ?Vitals Value Taken Time  ?BP 121/69 1335  ?Temp 97.2f   ?Pulse 64 03/01/22 1335  ?Resp 11 03/01/22 1335  ?SpO2 100 % 03/01/22 1335  ?Vitals shown include unvalidated device data. ? ?Last Pain:  ?Vitals:  ? 03/01/22 1022  ?TempSrc: Temporal  ?PainSc: 0-No pain  ?   ? ?  ? ?Complications: No notable events documented. ?

## 2022-03-01 NOTE — Anesthesia Preprocedure Evaluation (Signed)
Anesthesia Evaluation  ?Patient identified by MRN, date of birth, ID band ?Patient awake ? ? ? ?Reviewed: ?Allergy & Precautions, H&P , NPO status , Patient's Chart, lab work & pertinent test results, reviewed documented beta blocker date and time  ? ?Airway ?Mallampati: II ? ?TM Distance: >3 FB ?Neck ROM: full ? ? ? Dental ? ?(+) Teeth Intact ?  ?Pulmonary ?neg pulmonary ROS,  ?  ?Pulmonary exam normal ? ? ? ? ? ? ? Cardiovascular ?Exercise Tolerance: Good ?negative cardio ROS ?Normal cardiovascular exam ?Rhythm:regular Rate:Normal ? ? ?  ?Neuro/Psych ?negative neurological ROS ? negative psych ROS  ? GI/Hepatic ?Neg liver ROS, GERD  Medicated,  ?Endo/Other  ?negative endocrine ROS ? Renal/GU ?negative Renal ROS  ?negative genitourinary ?  ?Musculoskeletal ? ? Abdominal ?  ?Peds ? Hematology ?negative hematology ROS ?(+)   ?Anesthesia Other Findings ?Past Medical History: ?No date: Medical history non-contributory ?Past Surgical History: ?No date: NO PAST SURGERIES ?BMI   ? Body Mass Index: 32.61 kg/m?  ?  ? Reproductive/Obstetrics ?negative OB ROS ? ?  ? ? ? ? ? ? ? ? ? ? ? ? ? ?  ?  ? ? ? ? ? ? ? ? ?Anesthesia Physical ?Anesthesia Plan ? ?ASA: 2 ? ?Anesthesia Plan: General ETT  ? ?Post-op Pain Management:   ? ?Induction:  ? ?PONV Risk Score and Plan:  ? ?Airway Management Planned:  ? ?Additional Equipment:  ? ?Intra-op Plan:  ? ?Post-operative Plan:  ? ?Informed Consent: I have reviewed the patients History and Physical, chart, labs and discussed the procedure including the risks, benefits and alternatives for the proposed anesthesia with the patient or authorized representative who has indicated his/her understanding and acceptance.  ? ? ? ?Dental Advisory Given ? ?Plan Discussed with: CRNA ? ?Anesthesia Plan Comments:   ? ? ? ? ? ? ?Anesthesia Quick Evaluation ? ?

## 2022-03-01 NOTE — Anesthesia Postprocedure Evaluation (Signed)
Anesthesia Post Note ? ?Patient: Robin Barr ? ?Procedure(s) Performed: AN AD HOC LABOR EPIDURAL ? ?Patient location during evaluation: [facility] ?Anesthesia Type: Epidural ?Level of consciousness: awake and alert and oriented ?Pain management: pain level controlled ?Vital Signs Assessment: post-procedure vital signs reviewed and stable ?Respiratory status: respiratory function stable ?Cardiovascular status: stable ?Postop Assessment: no headache, no backache, patient able to bend at knees, no apparent nausea or vomiting, able to ambulate and adequate PO intake ?Anesthetic complications: no ? ? ?No notable events documented. ? ? ?Last Vitals:  ?Vitals:  ? 03/01/22 0230 03/01/22 0736  ?BP: 95/65 104/64  ?Pulse: 75 70  ?Resp: 18 16  ?Temp: 36.6 ?C 36.5 ?C  ?SpO2: 100% 96%  ?  ?Last Pain:  ?Vitals:  ? 03/01/22 0753  ?TempSrc:   ?PainSc: 6   ? ? ?  ?  ?  ?  ?  ?  ? ?Omer Jack F ? ? ? ? ?

## 2022-03-01 NOTE — Anesthesia Procedure Notes (Signed)
Spinal ? ?Patient location during procedure: OR ?Reason for block: surgical anesthesia ?Staffing ?Performed: resident/CRNA  ?Anesthesiologist: Molli Barrows, MD ?Resident/CRNA: Hedda Slade, CRNA ?Preanesthetic Checklist ?Completed: patient identified, IV checked, site marked, risks and benefits discussed, surgical consent, monitors and equipment checked, pre-op evaluation and timeout performed ?Spinal Block ?Patient position: sitting ?Prep: ChloraPrep ?Patient monitoring: heart rate, continuous pulse ox, blood pressure and cardiac monitor ?Approach: midline ?Location: L3-4 ?Injection technique: single-shot ?Needle ?Needle type: Whitacre and Introducer  ?Needle gauge: 24 G ?Needle length: 9 cm ?Assessment ?Sensory level: T6 ?Events: CSF return ?Additional Notes ?Sterile aseptic technique used throughout the procedure.  Negative paresthesia. Negative blood return. Positive free-flowing CSF. Expiration date of kit checked and confirmed. Patient tolerated procedure well, without complications. ? ? ? ? ? ?

## 2022-03-01 NOTE — Progress Notes (Signed)
Patient desires permanent sterilization.  Other reversible forms of contraception were discussed with patient; she declines all other modalities. Risks of procedure discussed with patient including but not limited to: risk of regret, permanence of method, bleeding, infection, injury to surrounding organs and need for additional procedures.  Failure risk of 1-2 % with increased risk of ectopic gestation if pregnancy occurs was also discussed with patient.  Patient verbalized understanding of these risks and wants to proceed with sterilization.  Written informed consent obtained.  To OR when ready. ? ?Tubal ligation ? ?We discussed these risks in Spanish with Language line interpreter [place], [phone] ?

## 2022-03-01 NOTE — Op Note (Signed)
[place] ?03/01/2022 ? ?PREOPERATIVE DIAGNOSES: Multiparity, undesired fertility ? ?POSTOPERATIVE DIAGNOSES: Multiparity, undesired fertility ? ?PROCEDURE:  Postpartum Bilateral Tubal Sterilization by bilateral partial salpingectomy, including fimbriae ? ?SURGEON: Dr. Benjaman Kindler ? ?ANESTHESIA:  Epidural and local analgesia using 12 ml of 0.5% Marcaine ? ?Anesthesiologist: No responsible provider has been recorded for the case. Anesthesiologist: Molli Barrows, MD ?CRNA: Hedda Slade, CRNA ? ?COMPLICATIONS:  None immediate. ? ?ESTIMATED BLOOD LOSS: minimal. ? ?FLUIDS: 300 ml LR. ? ?URINE OUTPUT:  Not assessed ? ?INDICATIONS:  41 y.o. [id number] with undesired fertility,status post vaginal delivery, desires permanent sterilization.  Other reversible forms of contraception were discussed with patient; she declines all other modalities. Risks of procedure discussed with patient including but not limited to: risk of regret, permanence of method, bleeding, infection, injury to surrounding organs and need for additional procedures.  Failure risk of 1 -2 % with increased risk of ectopic gestation if pregnancy occurs was also discussed with patient.   ?   ?FINDINGS:  Normal uterus, tubes, and ovaries. ? ?PROCEDURE DETAILS:  ?The patient was taken to the operating room where her epidural anesthesia was dosed up to surgical level and found to be adequate.  She was then placed in the dorsal supine position and prepped and draped in sterile fashion.  After an adequate timeout was performed, attention was turned to the patient's abdomen where a small transverse skin incision was made under the umbilical fold. The incision was taken down to the layer of fascia using the scalpel, and fascia was incised, and extended bilaterally using Mayo scissors. The peritoneum was entered in a sharp fashion. Attention was then turned to the patient's uterus, and left fallopian tube was identified and followed out to the fimbriated  end. Using a Ligasure bipolar cautery device, a portion of the left tube including the fimbriated end was cauterized and sharply excised.  A similar process was carried out on the right side allowing for bilateral tubal sterilization.  Good hemostasis was noted overall.  Local analgesia was poured over both adenexa.The instruments were then removed from the patient's abdomen and the fascial incision was repaired with 0 Vicryl, and the skin was closed with a 4-0 Vicryl subcuticular stitch. The patient tolerated the procedure well.  Instrument, sponge, and needle counts were correct times two.  The patient was then taken to the recovery room awake and in stable condition. ?   ? ?  ?

## 2022-03-02 ENCOUNTER — Encounter: Payer: Self-pay | Admitting: Obstetrics and Gynecology

## 2022-03-02 LAB — SURGICAL PATHOLOGY

## 2022-03-02 MED ORDER — WITCH HAZEL-GLYCERIN EX PADS: 1.0000 "application " | MEDICATED_PAD | CUTANEOUS | 12 refills | Status: AC | PRN

## 2022-03-02 MED ORDER — IBUPROFEN 600 MG PO TABS: 600.0000 mg | ORAL_TABLET | Freq: Four times a day (QID) | ORAL | 0 refills | Status: AC

## 2022-03-02 MED ORDER — ACETAMINOPHEN 325 MG PO TABS: 650.0000 mg | ORAL_TABLET | ORAL | Status: AC | PRN

## 2022-03-02 MED ORDER — BENZOCAINE-MENTHOL 20-0.5 % EX AERO: 1.0000 "application " | INHALATION_SPRAY | CUTANEOUS | Status: AC | PRN

## 2022-03-02 MED ORDER — OXYCODONE HCL 5 MG PO TABS: 5.0000 mg | ORAL_TABLET | ORAL | 0 refills | Status: AC | PRN

## 2022-03-02 MED ORDER — DIBUCAINE (PERIANAL) 1 % EX OINT: 1.0000 "application " | TOPICAL_OINTMENT | CUTANEOUS | Status: AC | PRN

## 2022-03-02 NOTE — Anesthesia Postprocedure Evaluation (Signed)
Anesthesia Post Note ? ?Patient: Robin Barr ? ?Procedure(s) Performed: POST PARTUM TUBAL LIGATION (Bilateral: Abdomen) ? ?Patient location during evaluation: Nursing Unit ?Anesthesia Type: Spinal ?Level of consciousness: awake ?Respiratory status: spontaneous breathing ?Anesthetic complications: no ? ? ?No notable events documented. ? ? ?Last Vitals:  ?Vitals:  ? 03/01/22 2300 03/02/22 0741  ?BP: 99/63 95/63  ?Pulse: 76 62  ?Resp: 18 18  ?Temp: 36.5 ?C 36.5 ?C  ?SpO2: 99% 100%  ?  ?Last Pain:  ?Vitals:  ? 03/02/22 0741  ?TempSrc: Oral  ?PainSc:   ? ? ?  ?  ?  ?  ?  ?  ? ?Lerry Liner ? ? ? ? ?

## 2022-03-02 NOTE — Progress Notes (Signed)
Infant discharged home. Discharge instructions and follow up appointment given to and reviewed with parent. Parent verbalized understanding. Tag removed. Car seat present. Escorted out by staff.  ?

## 2022-04-07 IMAGING — US US MFM OB DETAIL+14 WK
1 series · 13 of 28 positions shown · non-contrast
Comparison: none

[Series 1: us mfm ob detail+14 wk · 13 of 107 slices shown]
[im 4/107]
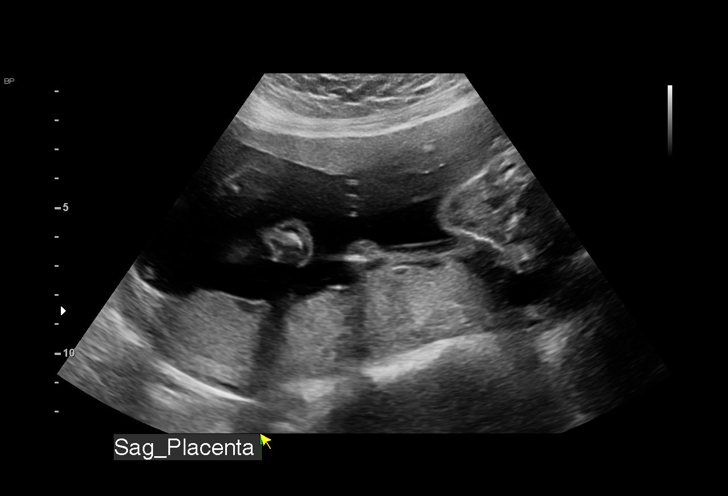
[im 12/107]
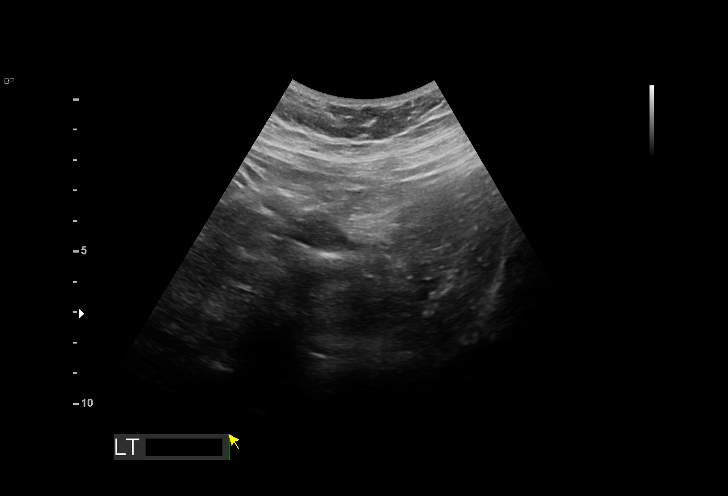
[im 20/107]
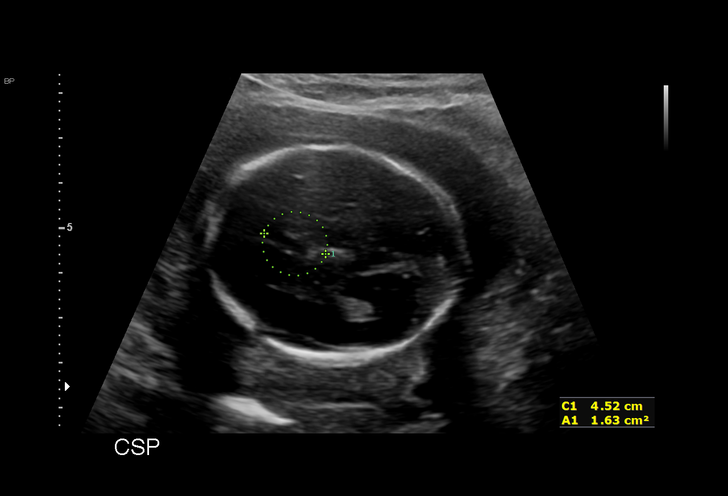
[im 28/107]
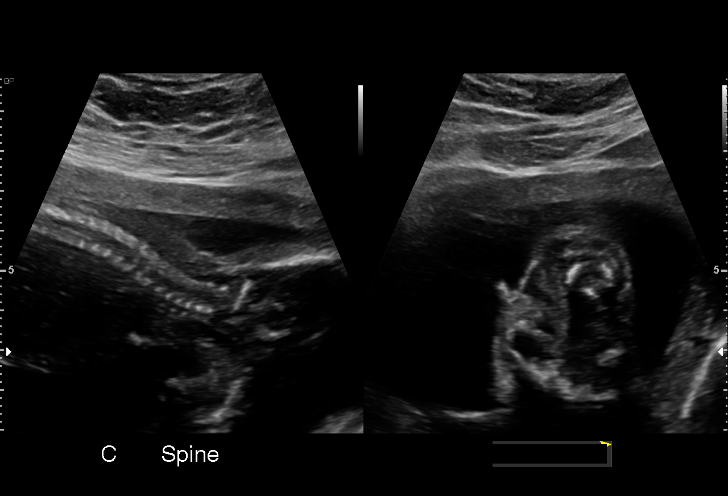
[im 36/107]
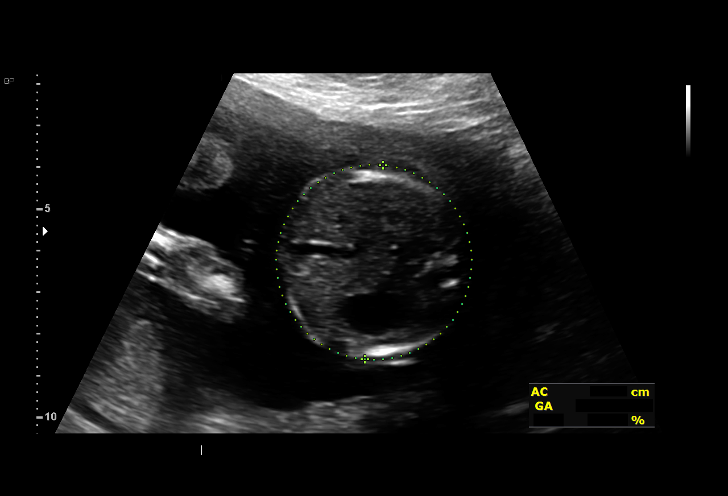
[im 44/107]
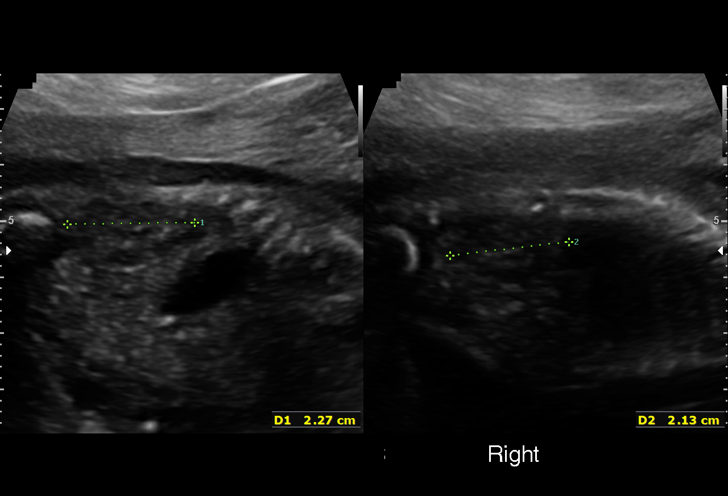
[im 55/107]
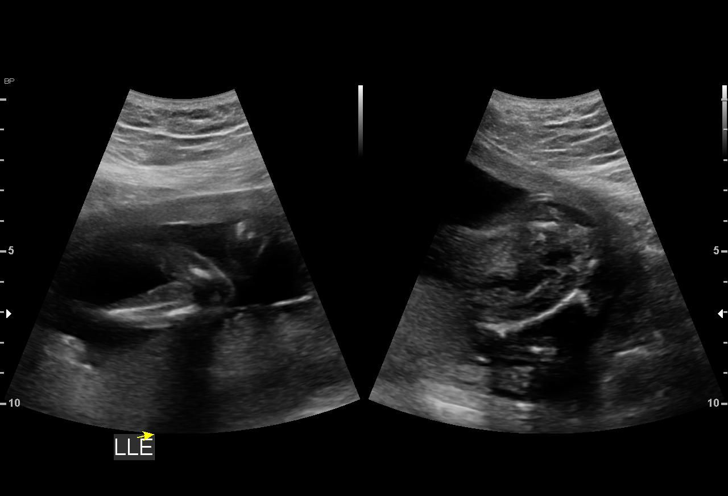
[im 63/107]
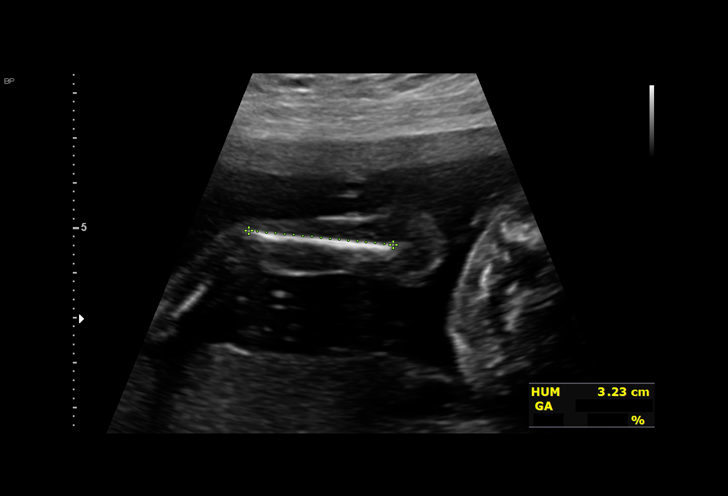
[im 71/107]
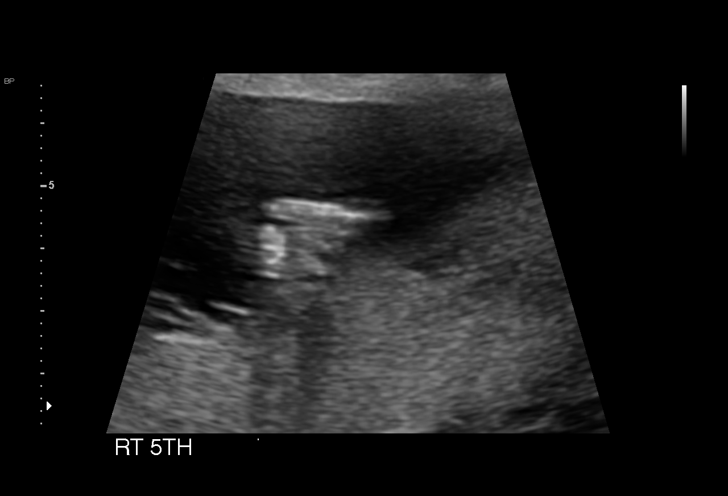
[im 79/107]
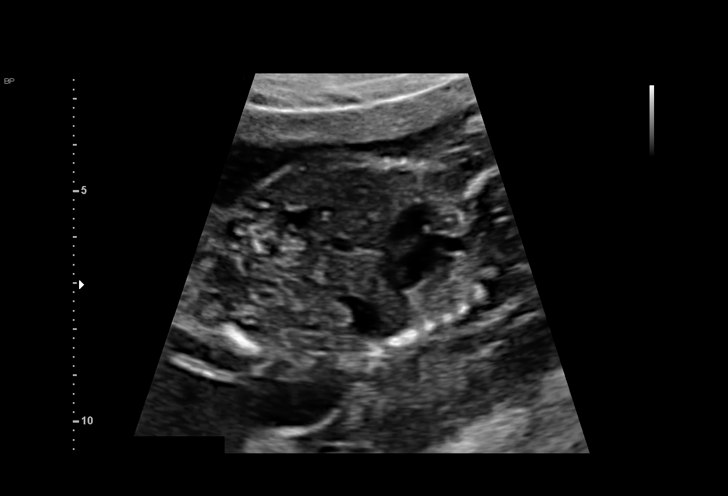
[im 87/107]
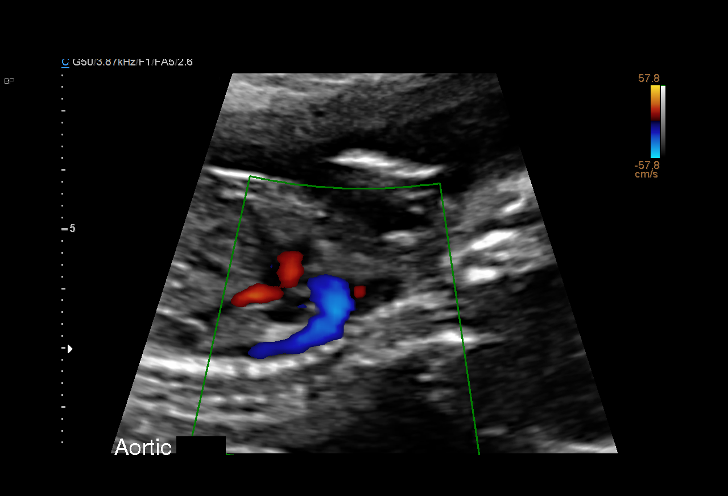
[im 95/107]
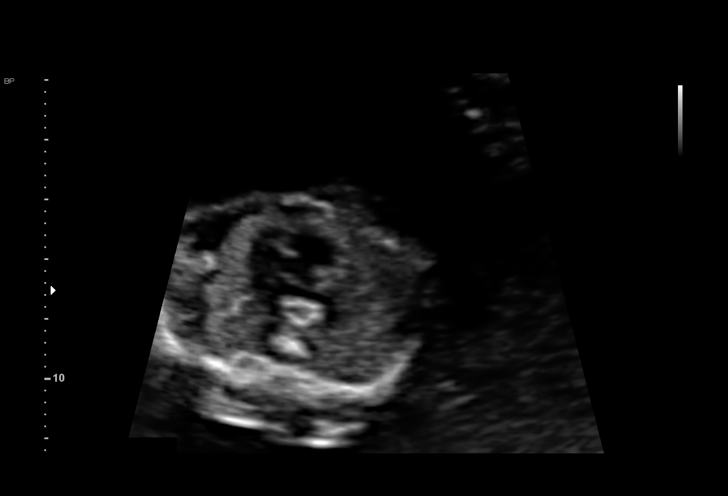
[im 103/107]
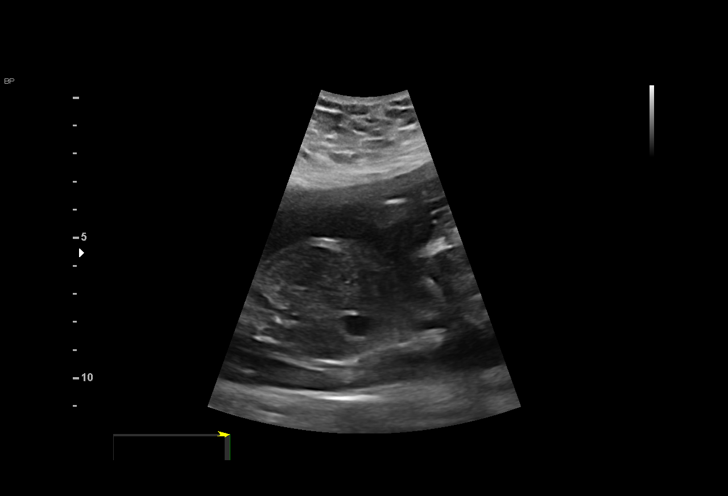

[13 of 28 positions shown; findings below may reference images not displayed]

[facility]

                   [facility]                                       [facility]

                                                      [facility]AN[facility]

Indications

 Advanced maternal age multigravida 35+,
 second trimester (40 y.o)
 Grand multiparity, antepartum
 Encounter for antenatal screening for
 malformations
 20 weeks gestation of pregnancy
Fetal Evaluation

 Num Of Fetuses:         1
 Fetal Heart Rate(bpm):  153
 Cardiac Activity:       Observed
 Presentation:           Cephalic
 Placenta:               Posterior
 P. Cord Insertion:      Visualized, central

 Amniotic Fluid
 AFI FV:      Within normal limits

                             Largest Pocket(cm)

Biometry

 BPD:      46.4  mm     G. Age:  20w 0d         38  %    CI:        73.62   %    70 - 86
                                                         FL/HC:      18.5   %    16.8 -
 HC:      171.8  mm     G. Age:  19w 5d         20  %    HC/AC:      1.15        1.09 -
 AC:      148.8  mm     G. Age:  20w 1d         39  %    FL/BPD:     68.3   %
 FL:       31.7  mm     G. Age:  19w 6d         27  %    FL/AC:      21.3   %    20 - 24
 HUM:        31  mm     G. Age:  20w 2d         52  %
 CER:      20.1  mm     G. Age:  19w 3d         39  %
 LV:        5.4  mm
 CM:        3.7  mm

 Est. FW:     325  gm    0 lb 11 oz      29  %
OB History

 Blood Type:   A+
 Gravidity:    6         Term:   5
 Living:       5
Gestational Age

 LMP:           20w 2d        Date:  06/20/21                 EDD:   03/27/22
 U/S Today:     20w 0d                                        EDD:   03/29/22
 Best:          20w 2d     Det. By:  LMP  (06/20/21)          EDD:   03/27/22
Anatomy

 Cranium:               Appears normal         Aortic Arch:            Appears normal
 Cavum:                 Appears normal         Ductal Arch:            Appears normal
 Ventricles:            Appears normal         Diaphragm:              Appears normal
 Choroid Plexus:        Appears normal         Stomach:                Appears normal, left
                                                                       sided
 Cerebellum:            Appears normal         Abdomen:                Appears normal
 Posterior Fossa:       Appears normal         Abdominal Wall:         Appears nml (cord
                                                                       insert, abd wall)
 Nuchal Fold:           Not applicable (>20    Cord Vessels:           Appears normal (3
                        wks GA)                                        vessel cord)
 Face:                  Appears normal         Kidneys:                Appear normal
                        (orbits and profile)
 Lips:                  Appears normal         Bladder:                Appears normal
 Thoracic:              Appears normal         Spine:                  Appears normal
 Heart:                 Appears normal         Upper Extremities:      Appears normal
                        (4CH, axis, and
                        situs)
 RVOT:                  Not well visualized    Lower Extremities:      Appears normal
 LVOT:                  Not well visualized

 Other:  Fetus appears to be female. Heels/feet and open hands/5th digits
         visualized. VC, 3VV and 3VTV visualized. Nasal bone, maxilla,
         mandible and falx visualized Technically difficult due to fetal position.
Cervix Uterus Adnexa

 Cervix
 Length:           3.62  cm.
 Normal appearance by transabdominal scan.

 Uterus
 Normal shape and size.

 Right Ovary
 Within normal limits.

 Left Ovary
 Not visualized.
 Cul De Sac
 No free fluid seen.

 Adnexa
 No adnexal mass visualized.
Impression

 Single intrauterine pregnancy here for a detailed anatomy
 due to advanced maternal age.
 Normal anatomy with measurements consistent with dates
 There is good fetal movement and amniotic fluid volume
 Suboptimal views of the fetal anatomy were obtained
 secondary to fetal position.

 I reviewed today's exam with Ms. Tiger. She has not
 performed genetic screening. She did opt to me with our
 genetic counselor today.

 All questions answered.
Recommendations

 Follow up growth in 4-6 weeks.

## 2022-05-19 IMAGING — US US MFM OB FOLLOW-UP
1 series · 14 of 28 positions shown · non-contrast
Comparison: none

[Series 1: us mfm ob follow-up · 37 acquisitions, 14 frames shown]
[im 2/37]
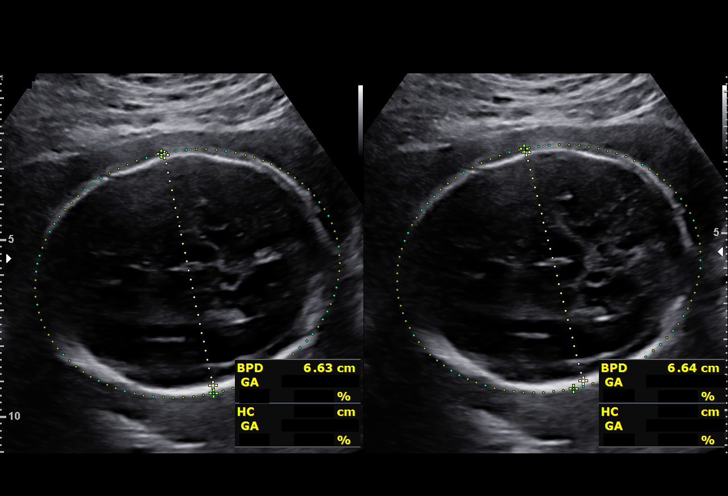
[im 5/37]
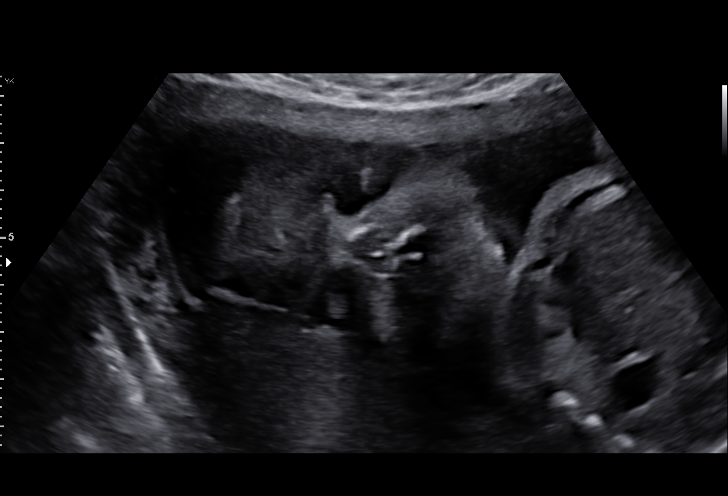
[im 7/37]
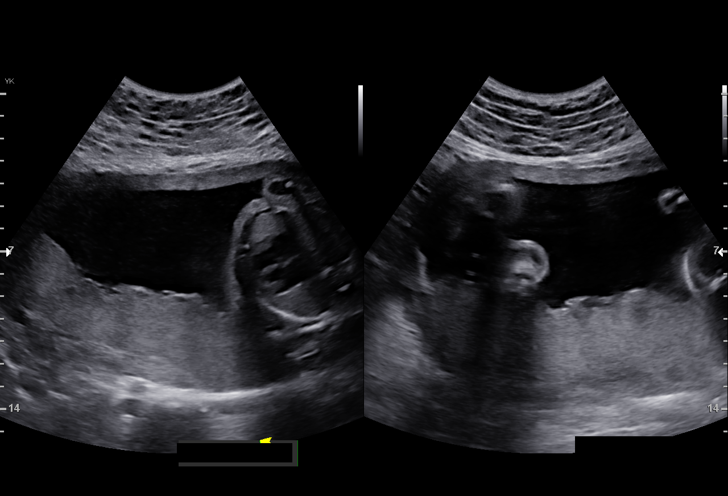
[im 10/37]
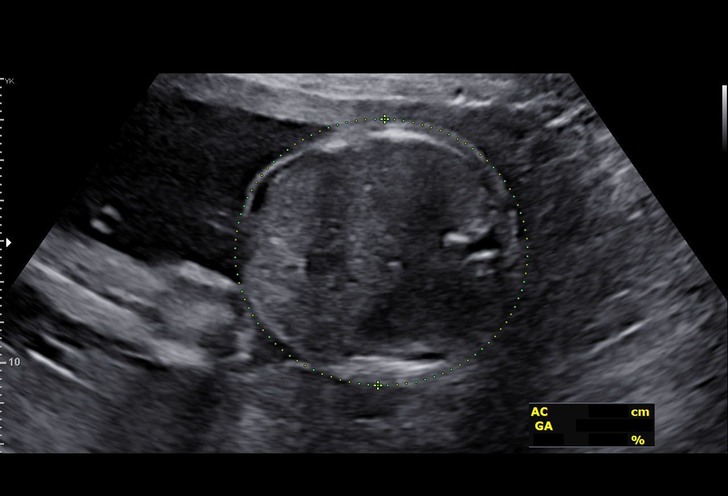
[im 13/37]
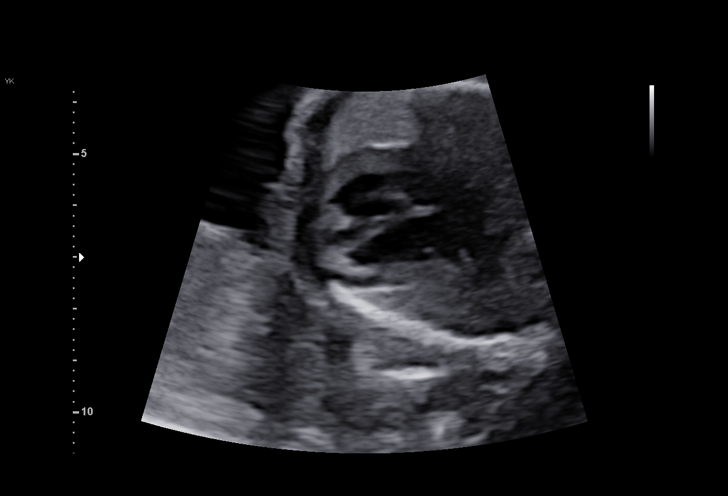
[im 15/37]
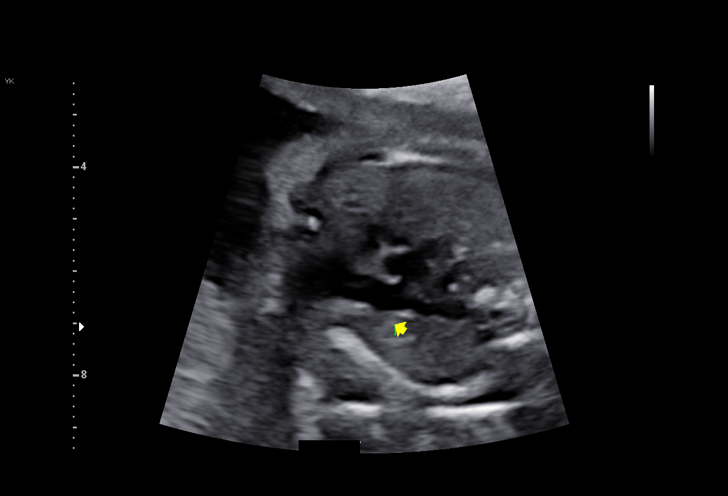
[im 18/37]
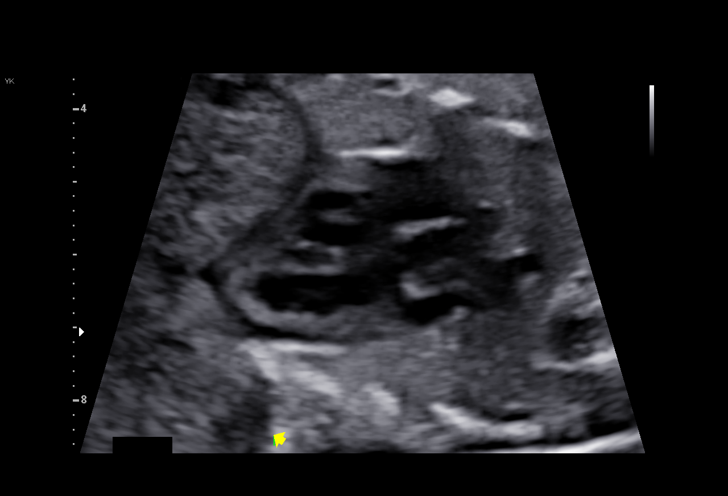
[im 21/37]
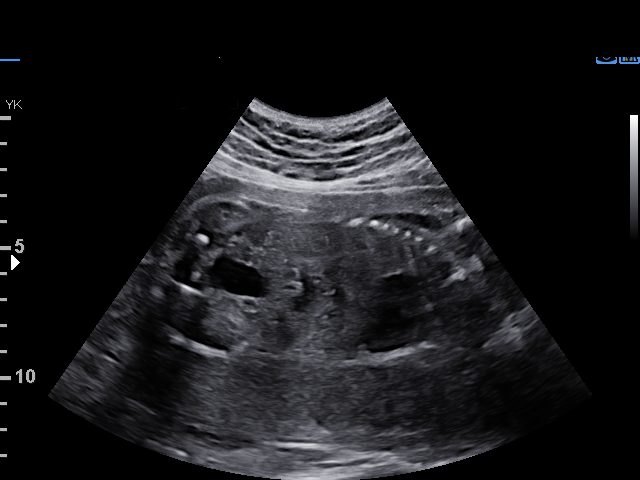
[im 23/37]
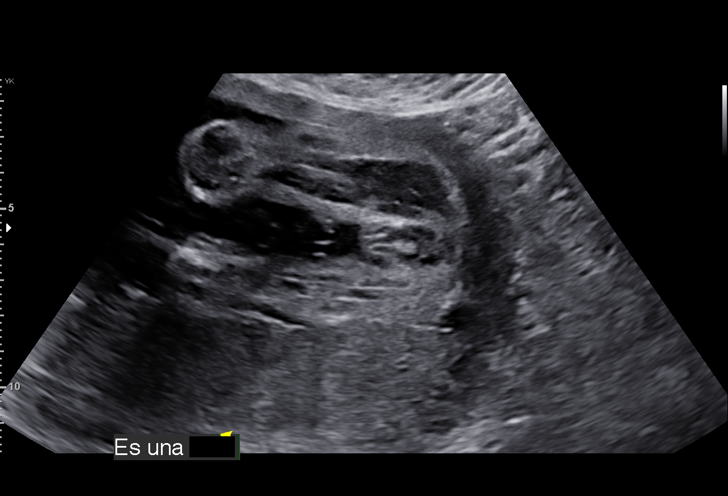
[im 26/37]
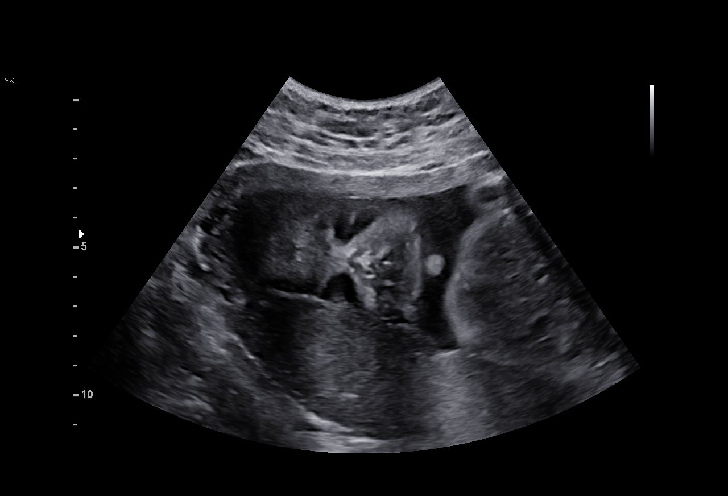
[im 29/37]
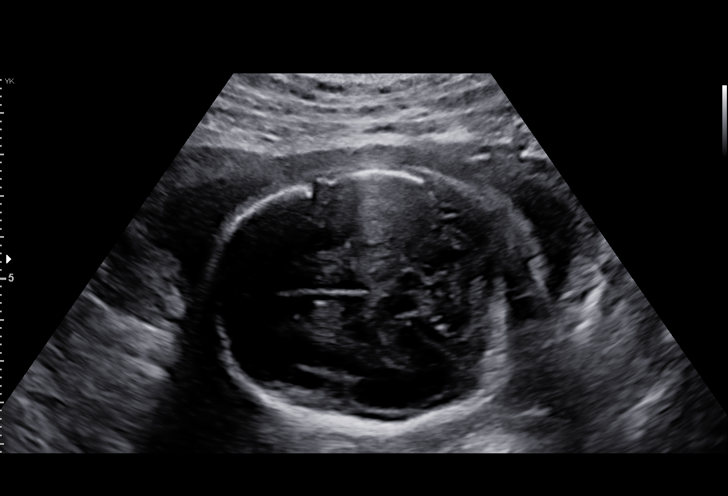
[im 31/37]
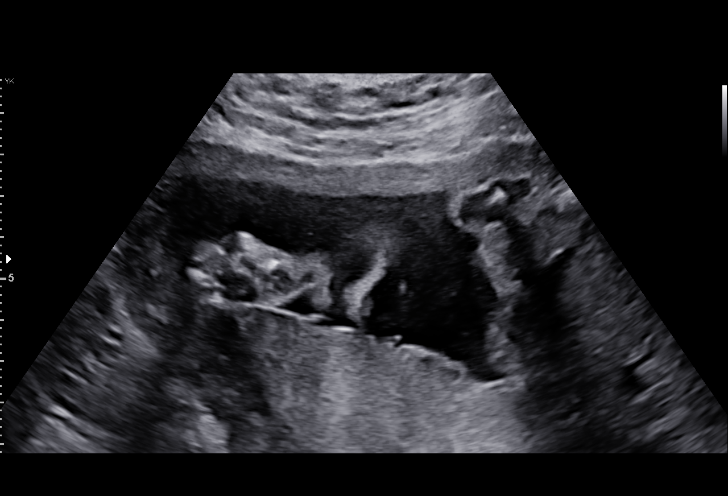
[im 34/37]
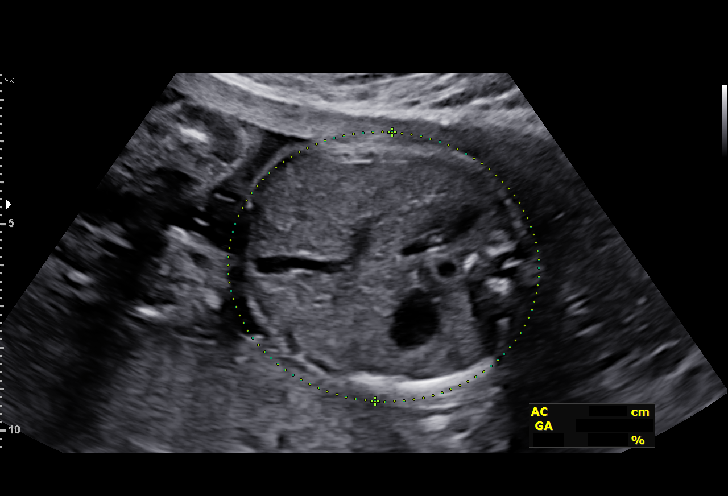
[im 37/37]
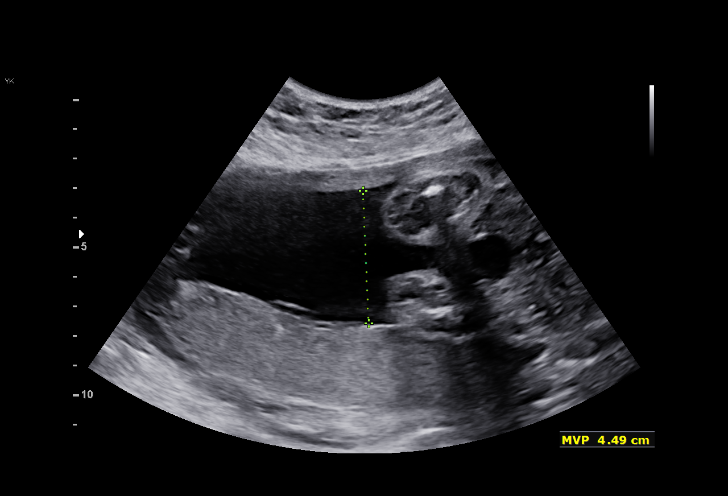

[14 of 28 positions shown; findings below may reference images not displayed]

[facility]

                                                            [facility]

Indications

 Advanced maternal age multigravida 35+,
 second trimester (40 y.o)
 Grand multiparity, antepartum
 Encounter for antenatal screening for
 malformations
 26 weeks gestation of pregnancy
 Obesity complicating pregnancy, second
 trimester
Fetal Evaluation

 Num Of Fetuses:         1
 Fetal Heart Rate(bpm):  147
 Cardiac Activity:       Observed
 Presentation:           Cephalic
 Placenta:               Posterior
 P. Cord Insertion:      Previously Visualized

 Amniotic Fluid
 AFI FV:      Within normal limits

                             Largest Pocket(cm)

Biometry

 BPD:      66.1  mm     G. Age:  26w 5d         53  %    CI:        73.33   %    70 - 86
                                                         FL/HC:      19.6   %    18.6 -
 HC:      245.3  mm     G. Age:  26w 4d         36  %    HC/AC:      1.12        1.04 -
 AC:       219   mm     G. Age:  26w 3d         43  %    FL/BPD:     72.9   %    71 - 87
 FL:       48.2  mm     G. Age:  26w 1d         31  %    FL/AC:      22.0   %    20 - 24

 Est. FW:     922  gm      2 lb 1 oz     40  %
OB History

 Blood Type:   A+
 Gravidity:    6         Term:   5
 Living:       5
Gestational Age

 LMP:           26w 2d        Date:  06/20/21                 EDD:   03/27/22
 U/S Today:     26w 3d                                        EDD:   03/26/22
 Best:          26w 2d     Det. By:  LMP  (06/20/21)          EDD:   03/27/22
Anatomy

 Cranium:               Appears normal         Aortic Arch:            Previously seen
 Cavum:                 Appears normal         Ductal Arch:            Previously seen
 Ventricles:            Appears normal         Diaphragm:              Appears normal
 Choroid Plexus:        Previously seen        Stomach:                Appears normal, left
                                                                       sided
 Cerebellum:            Appears normal         Abdomen:                Appears normal
 Posterior Fossa:       Appears normal         Abdominal Wall:         Appears nml (cord
                                                                       insert, abd wall)
 Nuchal Fold:           Not applicable (>20    Cord Vessels:           Appears normal (3
                        wks GA)                                        vessel cord)
 Face:                  Orbits and profile     Kidneys:                Appear normal
                        previously seen
 Lips:                  Appears normal         Bladder:                Appears normal
 Thoracic:              Appears normal         Spine:                  Previously seen
 Heart:                 Appears normal         Upper Extremities:      Previously seen
                        (4CH, axis, and
                        situs)
 RVOT:                  Appears normal         Lower Extremities:      Previously seen
 LVOT:                  Appears normal

 Other:  Fetus appears to be female.
Cervix Uterus Adnexa

 Cervix
 Not visualized (advanced GA >57wks)
Impression

 Patient returned for completion of fetal anatomy .Amniotic
 fluid is normal and good fetal activity is seen .Fetal biometry
 is consistent with her previously-established dates .Fetal
 anatomical survey was completed and appears normal.
Recommendations

 -An appointment was made for her to return in 6 weeks for
 fetal growth assessment (AMA).
                 [place], Siddhi

## 2024-05-29 ENCOUNTER — Other Ambulatory Visit: Payer: Self-pay

## 2024-05-29 ENCOUNTER — Encounter: Payer: Self-pay | Admitting: Pediatrics

## 2024-05-29 DIAGNOSIS — N6310 Unspecified lump in the right breast, unspecified quadrant: Secondary | ICD-10-CM

## 2024-07-23 ENCOUNTER — Ambulatory Visit
Admission: RE | Admit: 2024-07-23 | Discharge: 2024-07-23 | Disposition: A | Payer: Self-pay | Source: Ambulatory Visit | Attending: Obstetrics and Gynecology | Admitting: Obstetrics and Gynecology

## 2024-07-23 ENCOUNTER — Ambulatory Visit: Payer: Self-pay | Attending: Obstetrics and Gynecology | Admitting: *Deleted

## 2024-07-23 ENCOUNTER — Ambulatory Visit
Admission: RE | Admit: 2024-07-23 | Discharge: 2024-07-23 | Disposition: A | Discharge status: Home / Self Care | Payer: Self-pay | Source: Ambulatory Visit | Attending: Obstetrics and Gynecology | Admitting: Obstetrics and Gynecology

## 2024-07-23 VITALS — BP 114/76 | Ht 61.0 in | Wt 147.3 lb

## 2024-07-23 DIAGNOSIS — N6310 Unspecified lump in the right breast, unspecified quadrant: Secondary | ICD-10-CM | POA: Insufficient documentation

## 2024-07-23 DIAGNOSIS — N6311 Unspecified lump in the right breast, upper outer quadrant: Secondary | ICD-10-CM

## 2024-07-23 DIAGNOSIS — Z1239 Encounter for other screening for malignant neoplasm of breast: Secondary | ICD-10-CM

## 2024-07-23 DIAGNOSIS — N644 Mastodynia: Secondary | ICD-10-CM

## 2024-07-23 NOTE — Patient Instructions (Signed)
 Explained breast self awareness with Robin Barr. Patient did not need a Pap smear today due to last Pap smear and HPV typing was 12/16/2020. Let her know BCCCP will cover Pap smears and HPV typing every 5 years unless has a history of abnormal Pap smears. Referred patient to the [facility] for a diagnostic mammogram. Appointment scheduled Tuesday, July 23, 2024 at 1340. Patient aware of appointment and will be there. Damian Teffany Blaszczyk verbalized understanding.  Inez Rosato, Wanda Ship, RN 12:38 PM

## 2024-07-23 NOTE — Progress Notes (Signed)
 Ms. Dalis Beers is a 43 y.o. female who presents to [facility] clinic today with complaint of right breast lump and pain x 2 months. Patient states the pain comes and goes. Patient rates the pain at a 2-3 out of 10.    Pap Smear: Pap smear not completed today. Last Pap smear was 12/16/2020 at the [facility] clinic and was normal with negative HPV. Per patient has no history of an abnormal Pap smear. Last Pap smear result is available in Epic.   Physical exam: Breasts Breasts symmetrical. No skin abnormalities bilateral breasts. No nipple retraction bilateral breasts. No nipple discharge bilateral breasts. No lymphadenopathy. No lumps palpated left breast. Palpated a lump within the right breast at 10 o'clock 2 cm from the nipple. Complaints of pain when palpated right breast lump on exam.    Pelvic/Bimanual Pap is not indicated today per BCCCP guidelines.   Smoking History: Patient has never smoked.   Patient Navigation: Patient education provided. Access to services provided for patient through [employer] program. Spanish interpreter Damon Pierce from [facility] provided.    Breast and Cervical Cancer Risk Assessment: Patient does not have family history of breast cancer, known genetic mutations, or radiation treatment to the chest before age 62. Patient does not have history of cervical dysplasia, immunocompromised, or DES exposure in-utero.  Risk Scores as of Encounter on 07/23/2024     [place]isa           5-year 0.4%   Lifetime 7.33%   This patient is [place]/Latina but has no documented birth country, so the [place] model used data from [place] patients to calculate their risk score. Document a birth country in the Demographics activity for a more accurate score.         Last calculated by Rogerio Tempie SQUIBB, LPN on 89/11/7972 at 12:42 PM       A: BCCCP exam without pap smear Complaint of right breast lump and pain.  P: Referred patient to the [facility] for  a diagnostic mammogram. Appointment scheduled Tuesday, July 23, 2024 at 1340.  Driscilla Wanda SQUIBB, RN 07/23/2024 12:38 PM

## 2024-07-24 ENCOUNTER — Other Ambulatory Visit: Payer: Self-pay | Admitting: Obstetrics and Gynecology

## 2024-07-24 DIAGNOSIS — R928 Other abnormal and inconclusive findings on diagnostic imaging of breast: Secondary | ICD-10-CM

## 2024-08-09 ENCOUNTER — Ambulatory Visit
Admission: RE | Admit: 2024-08-09 | Discharge: 2024-08-09 | Disposition: A | Discharge status: Home / Self Care | Payer: Self-pay | Source: Ambulatory Visit | Attending: Obstetrics and Gynecology | Admitting: Obstetrics and Gynecology

## 2024-08-09 ENCOUNTER — Encounter: Payer: Self-pay | Admitting: Radiology

## 2024-08-09 DIAGNOSIS — D241 Benign neoplasm of right breast: Secondary | ICD-10-CM | POA: Insufficient documentation

## 2024-08-09 DIAGNOSIS — R928 Other abnormal and inconclusive findings on diagnostic imaging of breast: Secondary | ICD-10-CM | POA: Insufficient documentation

## 2024-08-09 HISTORY — PX: BREAST BIOPSY: SHX20

## 2024-08-09 MED ORDER — LIDOCAINE 1 % OPTIME INJ - NO CHARGE
2.0000 mL | Freq: Once | INTRAMUSCULAR | Status: AC
  Administered 2024-08-09: 2 mL via INTRADERMAL
  Filled 2024-08-09: qty 2

## 2024-08-09 MED ORDER — LIDOCAINE-EPINEPHRINE 1 %-1:100000 IJ SOLN
5.0000 mL | Freq: Once | INTRAMUSCULAR | Status: AC
  Administered 2024-08-09: 5 mL via INTRADERMAL
  Filled 2024-08-09: qty 5

## 2024-08-12 LAB — SURGICAL PATHOLOGY
